# Patient Record
Sex: Female | Born: 1980 | Race: White | Hispanic: No | Smoking: Current every day smoker
Health system: Southern US, Community
[De-identification: ages and names within clinical notes are randomized; demographics above are authoritative.]

## PROBLEM LIST (undated history)

## (undated) DIAGNOSIS — F419 Anxiety disorder, unspecified: Secondary | ICD-10-CM

## (undated) DIAGNOSIS — N83209 Unspecified ovarian cyst, unspecified side: Secondary | ICD-10-CM

## (undated) DIAGNOSIS — R51 Headache: Secondary | ICD-10-CM

## (undated) DIAGNOSIS — N809 Endometriosis, unspecified: Secondary | ICD-10-CM

## (undated) DIAGNOSIS — I82402 Acute embolism and thrombosis of unspecified deep veins of left lower extremity: Secondary | ICD-10-CM

## (undated) DIAGNOSIS — M549 Dorsalgia, unspecified: Secondary | ICD-10-CM

## (undated) HISTORY — DX: Endometriosis, unspecified: N80.9

## (undated) HISTORY — PX: URETHRA SURGERY: SHX824

## (undated) HISTORY — DX: Anxiety disorder, unspecified: F41.9

## (undated) HISTORY — PX: PELVIC LAPAROSCOPY: SHX162

## (undated) HISTORY — PX: TONSILLECTOMY AND ADENOIDECTOMY: SUR1326

## (undated) HISTORY — DX: Unspecified ovarian cyst, unspecified side: N83.209

---

## 2009-02-23 ENCOUNTER — Emergency Department: Payer: Self-pay | Admitting: Emergency Medicine

## 2009-02-23 IMAGING — CR DG CHEST 2V
1 series · 3 of 3 positions shown · non-contrast
Comparison: none

REASON FOR EXAM: back pain left scapula pain
COMMENTS:

PROCEDURE:     DXR - DXR CHEST PA (OR AP) AND LATERAL  - February 23, 2009  [DATE]
RESULT:     The lungs are clear. The cardiac silhouette and visualized bony
skeleton are unremarkable.  There does appear to be a mild pectus excavatum
deformity.

[Series 1: view not recorded · 0.17mm/px · 3 of 3 slices shown]
[im 1/3]
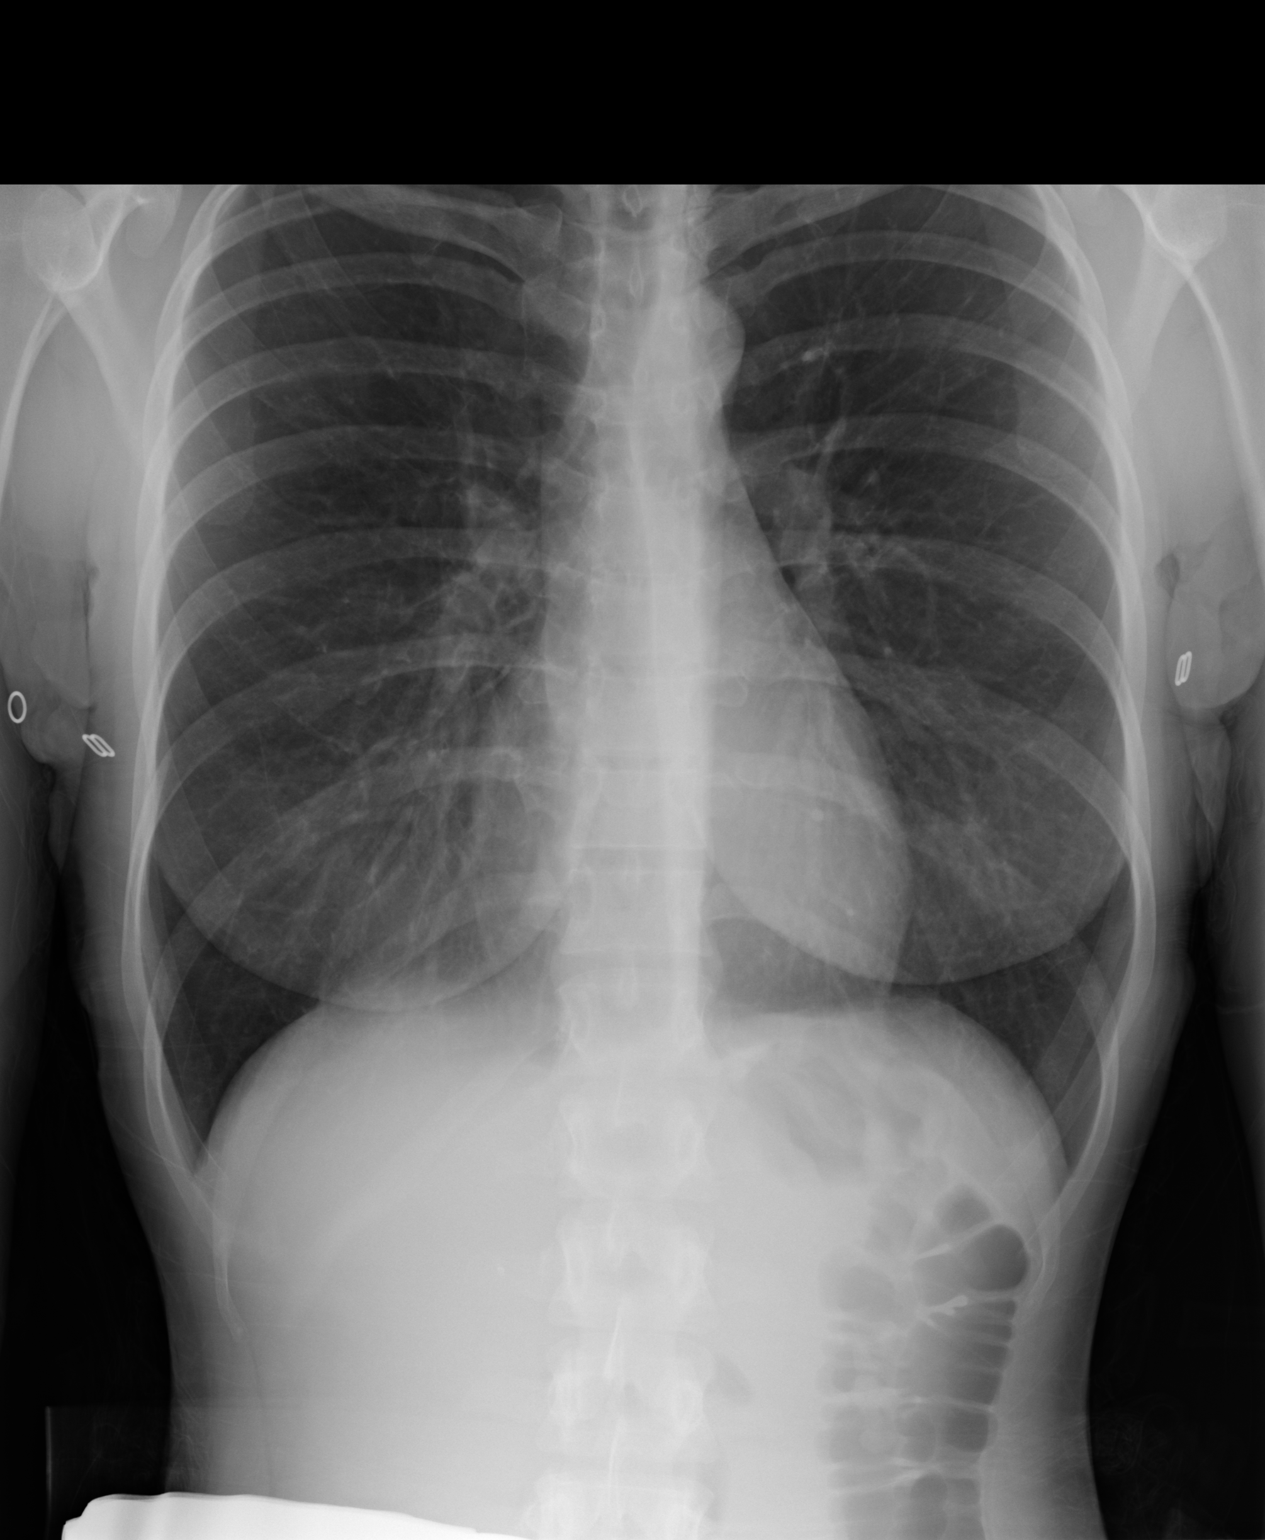
[im 2/3]
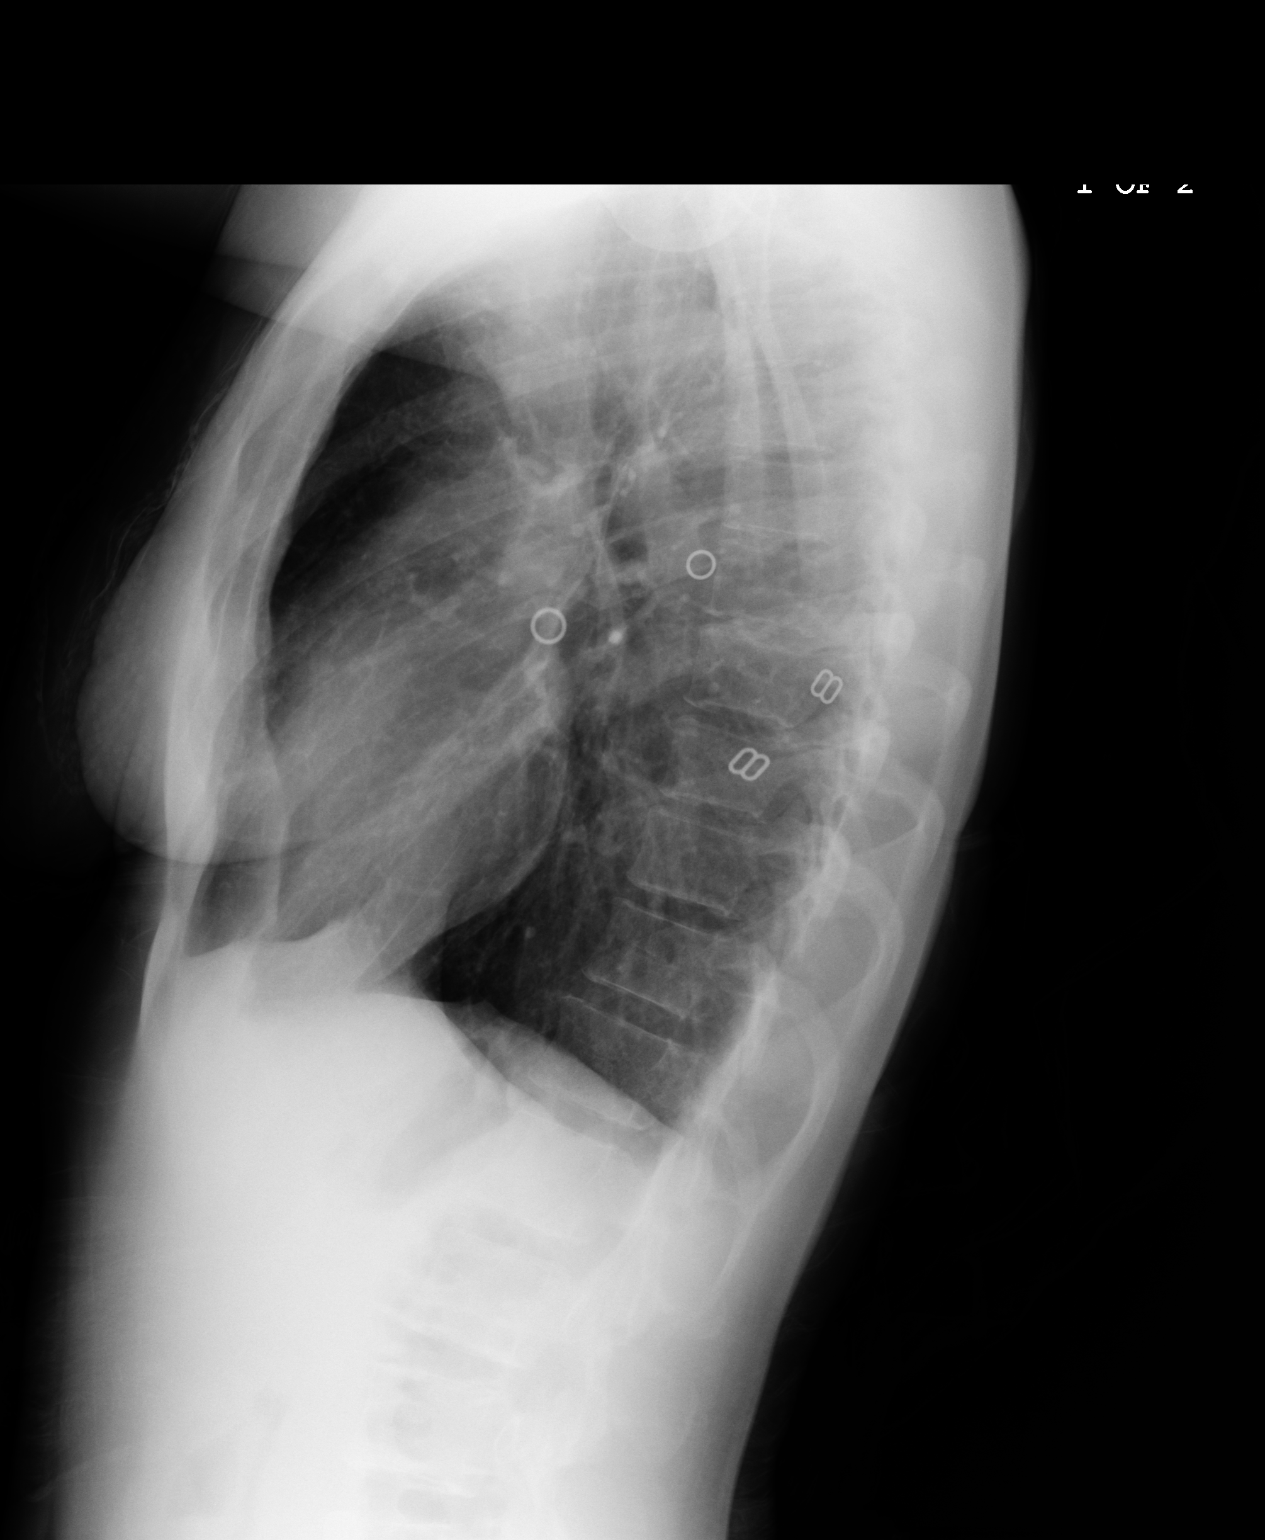
[im 3/3]
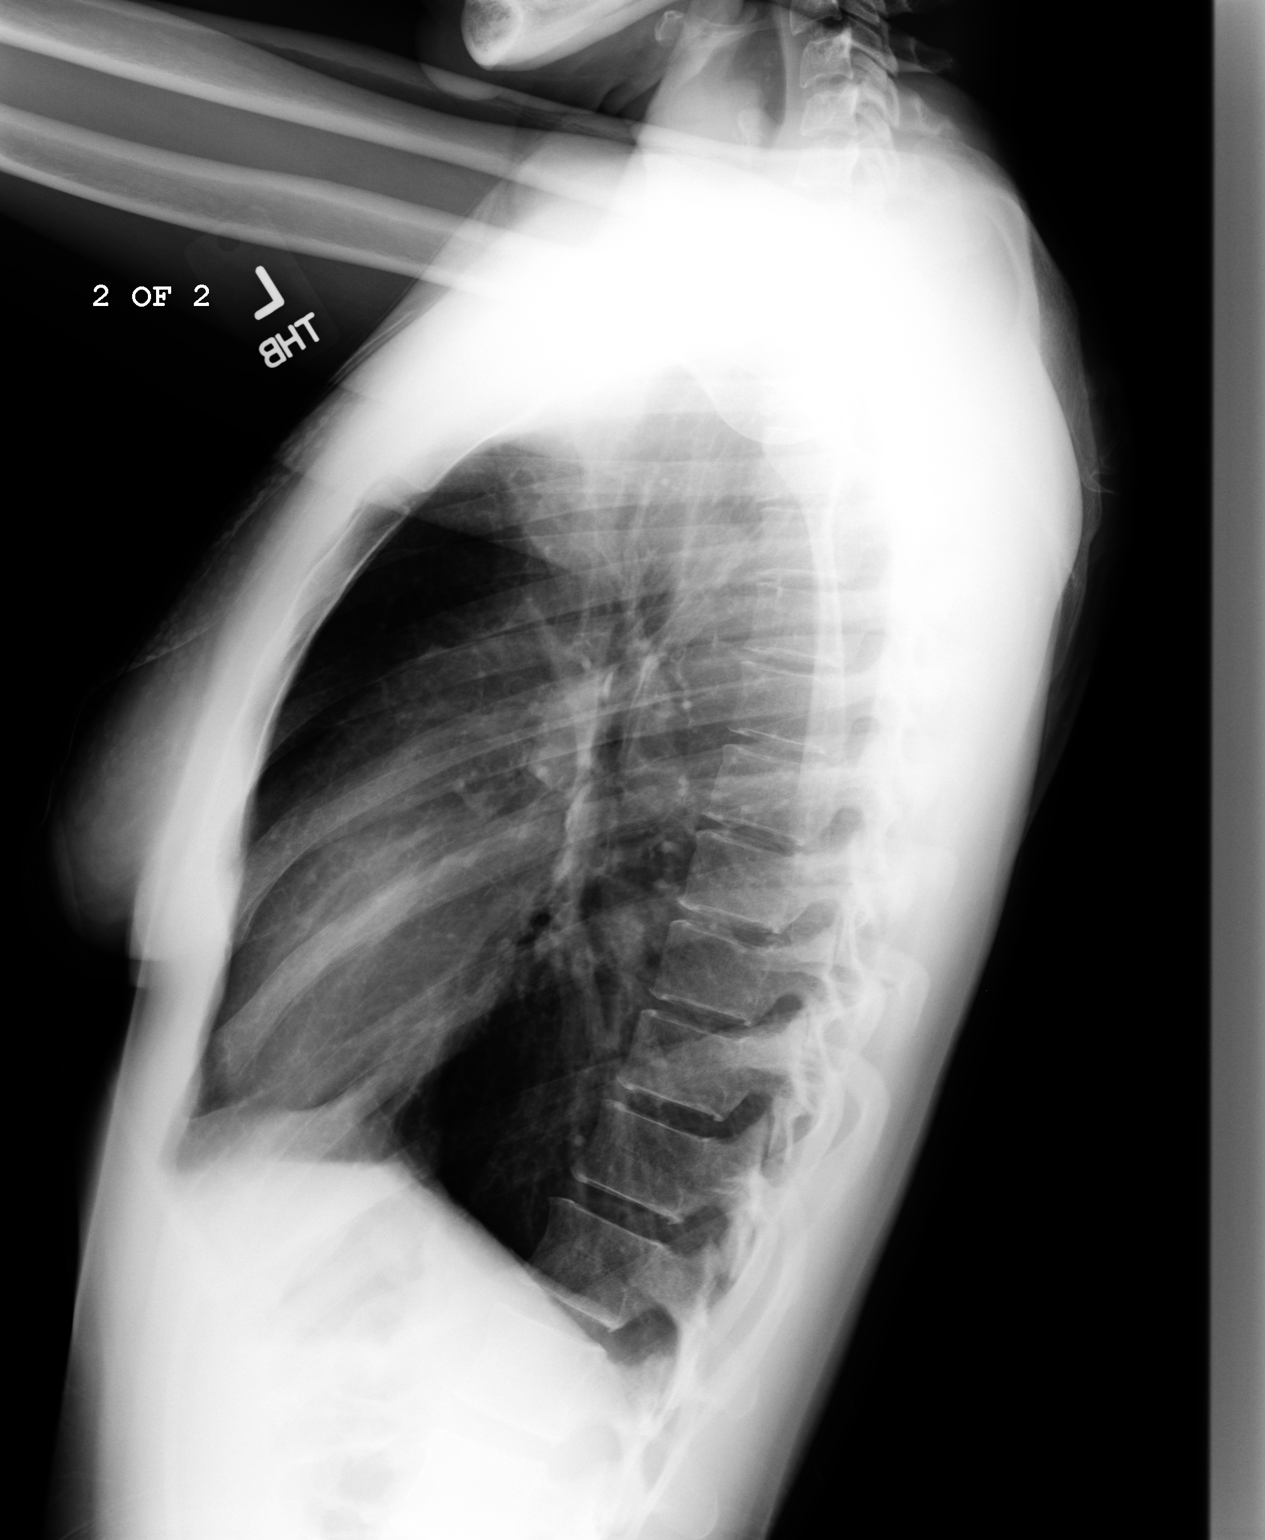

[3 of 3 positions shown; findings below may reference images not displayed]

IMPRESSION: Chest radiograph without evidence of acute cardiopulmonary
disease.

## 2009-02-23 IMAGING — CR DG SHOULDER 3+V*L*
1 series · 3 of 3 positions shown · non-contrast
Comparison: none

REASON FOR EXAM: pain
COMMENTS:

PROCEDURE:     DXR - DXR SHOULDER LEFT COMPLETE  - February 23, 2009  [DATE]
RESULT:     There does not appear to be evidence of fracture, dislocation or
malalignment.  The left lung apex appears unremarkable.

[Series 1: view not recorded · 0.17mm/px · 3 of 3 slices shown]
[im 1/3]
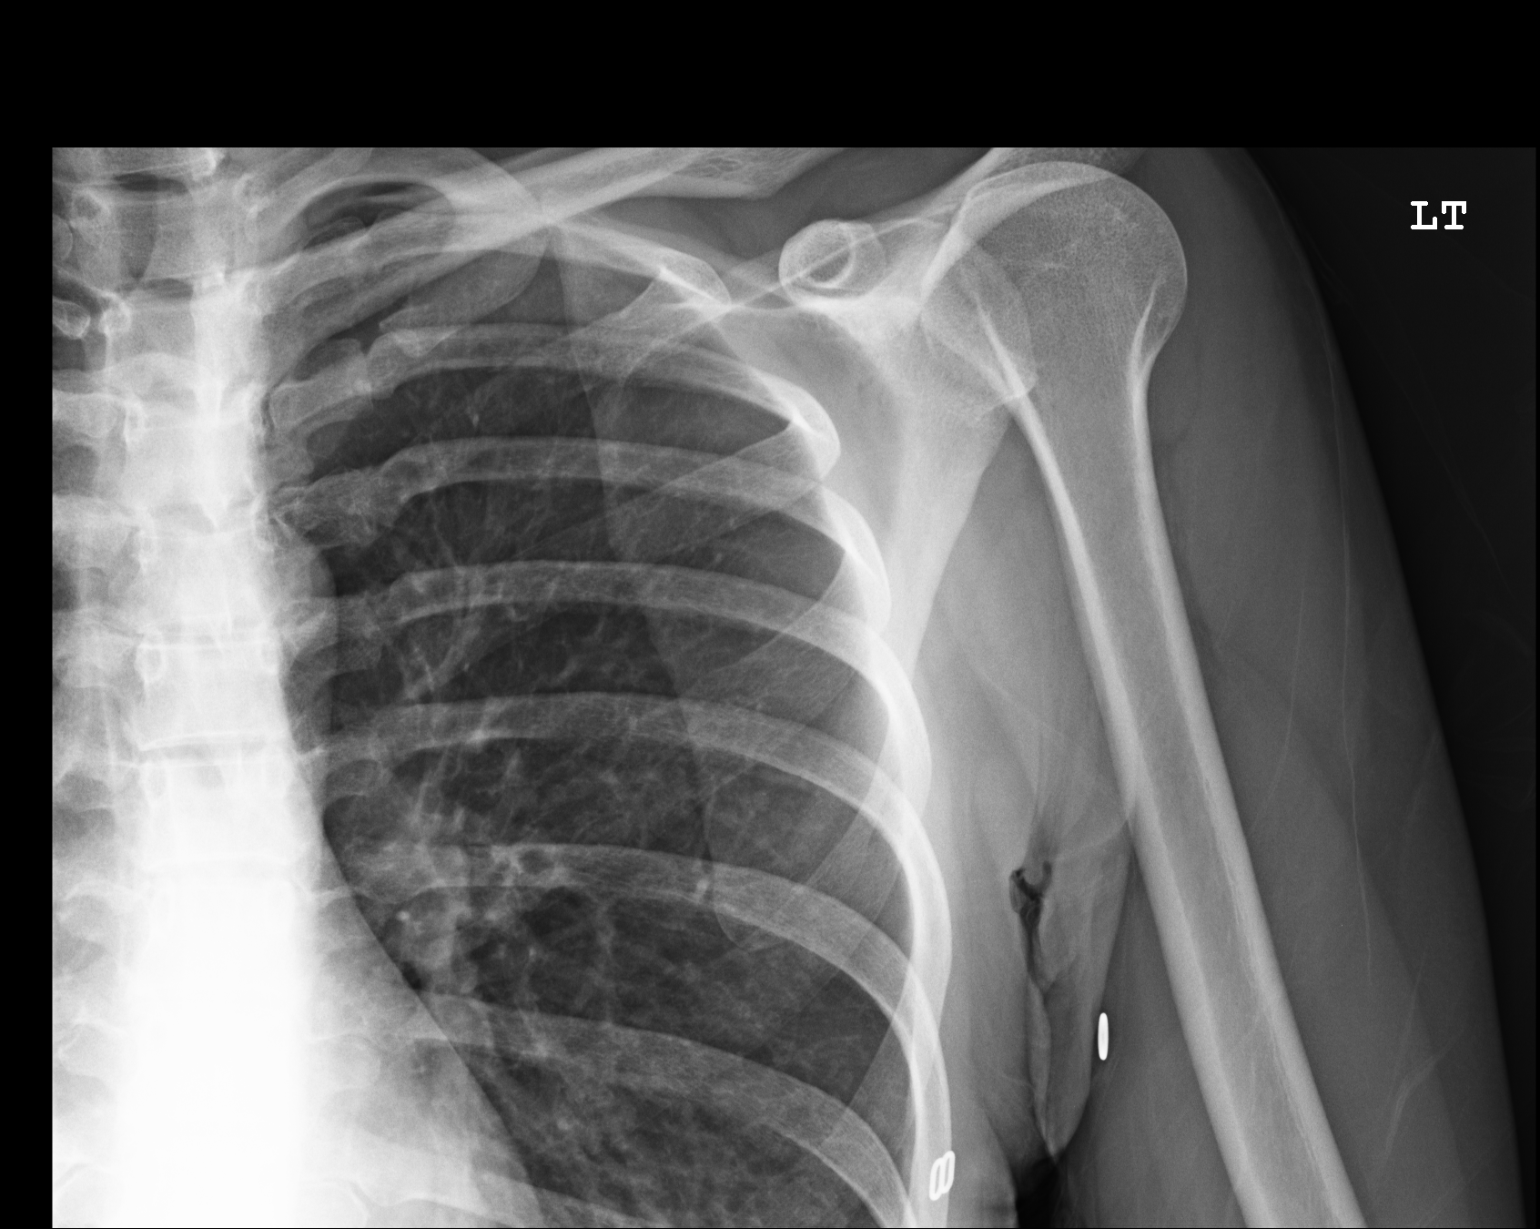
[im 2/3]
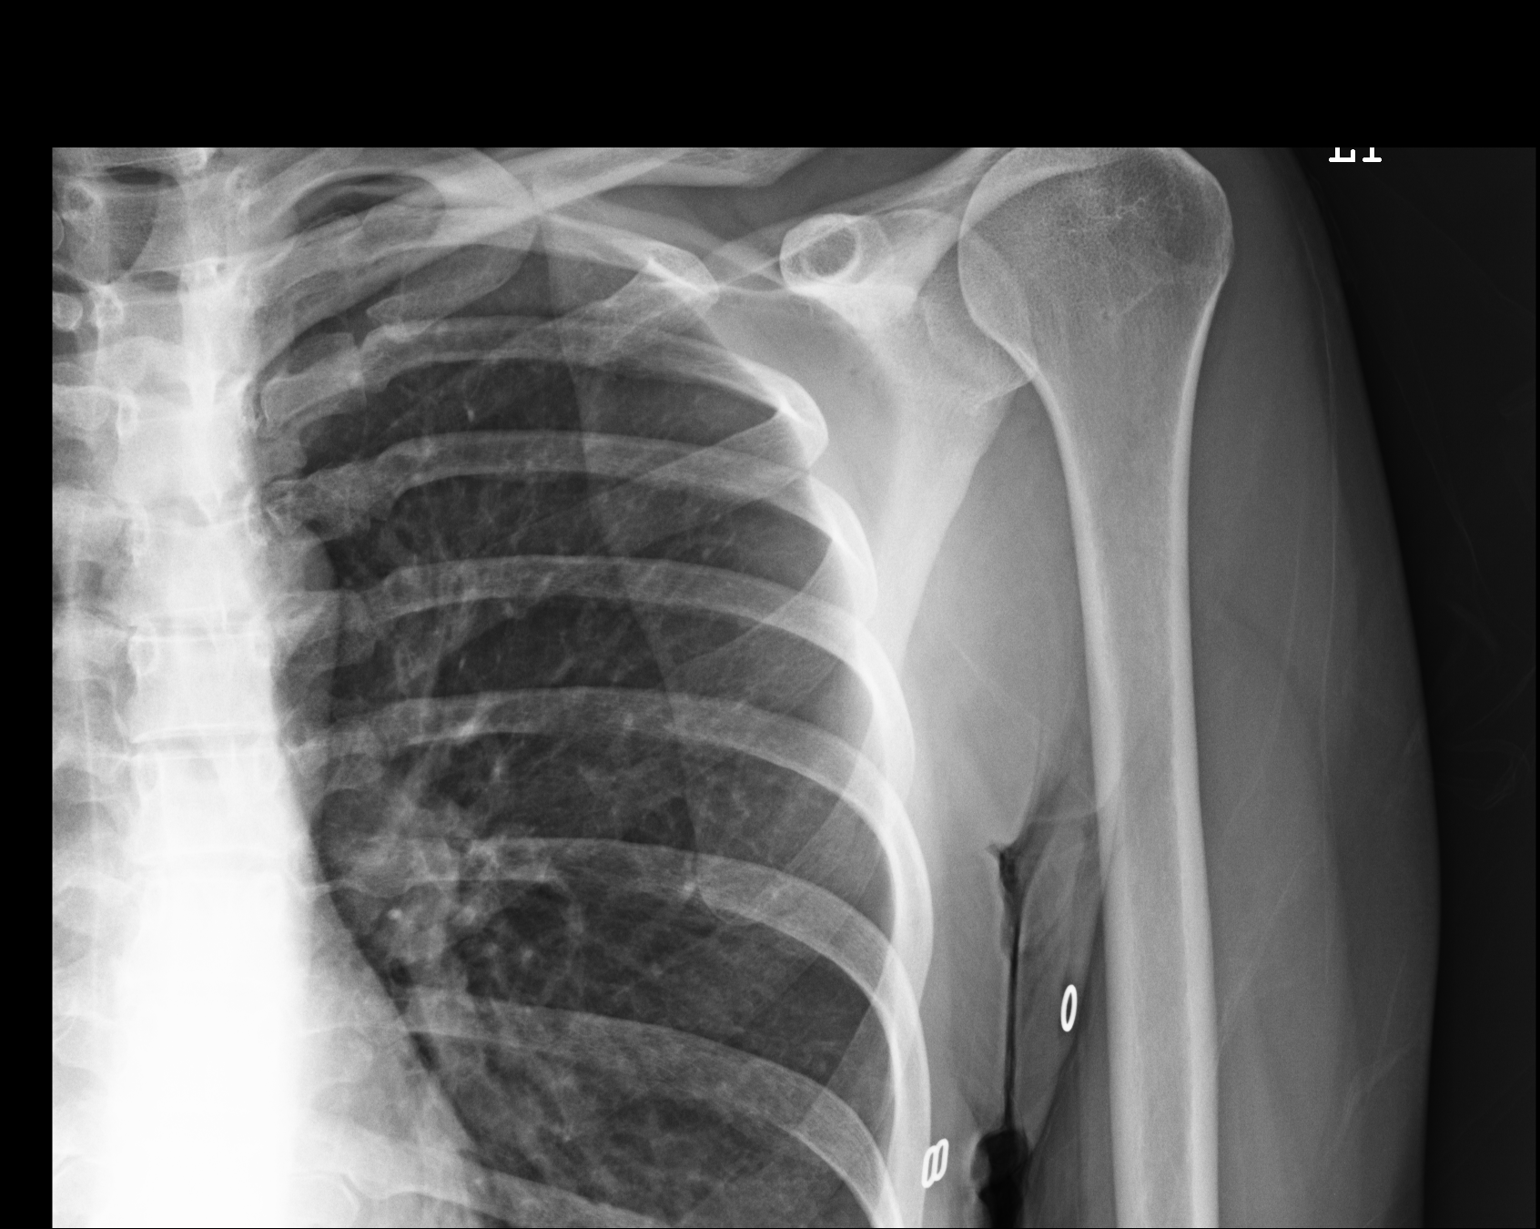
[im 3/3]
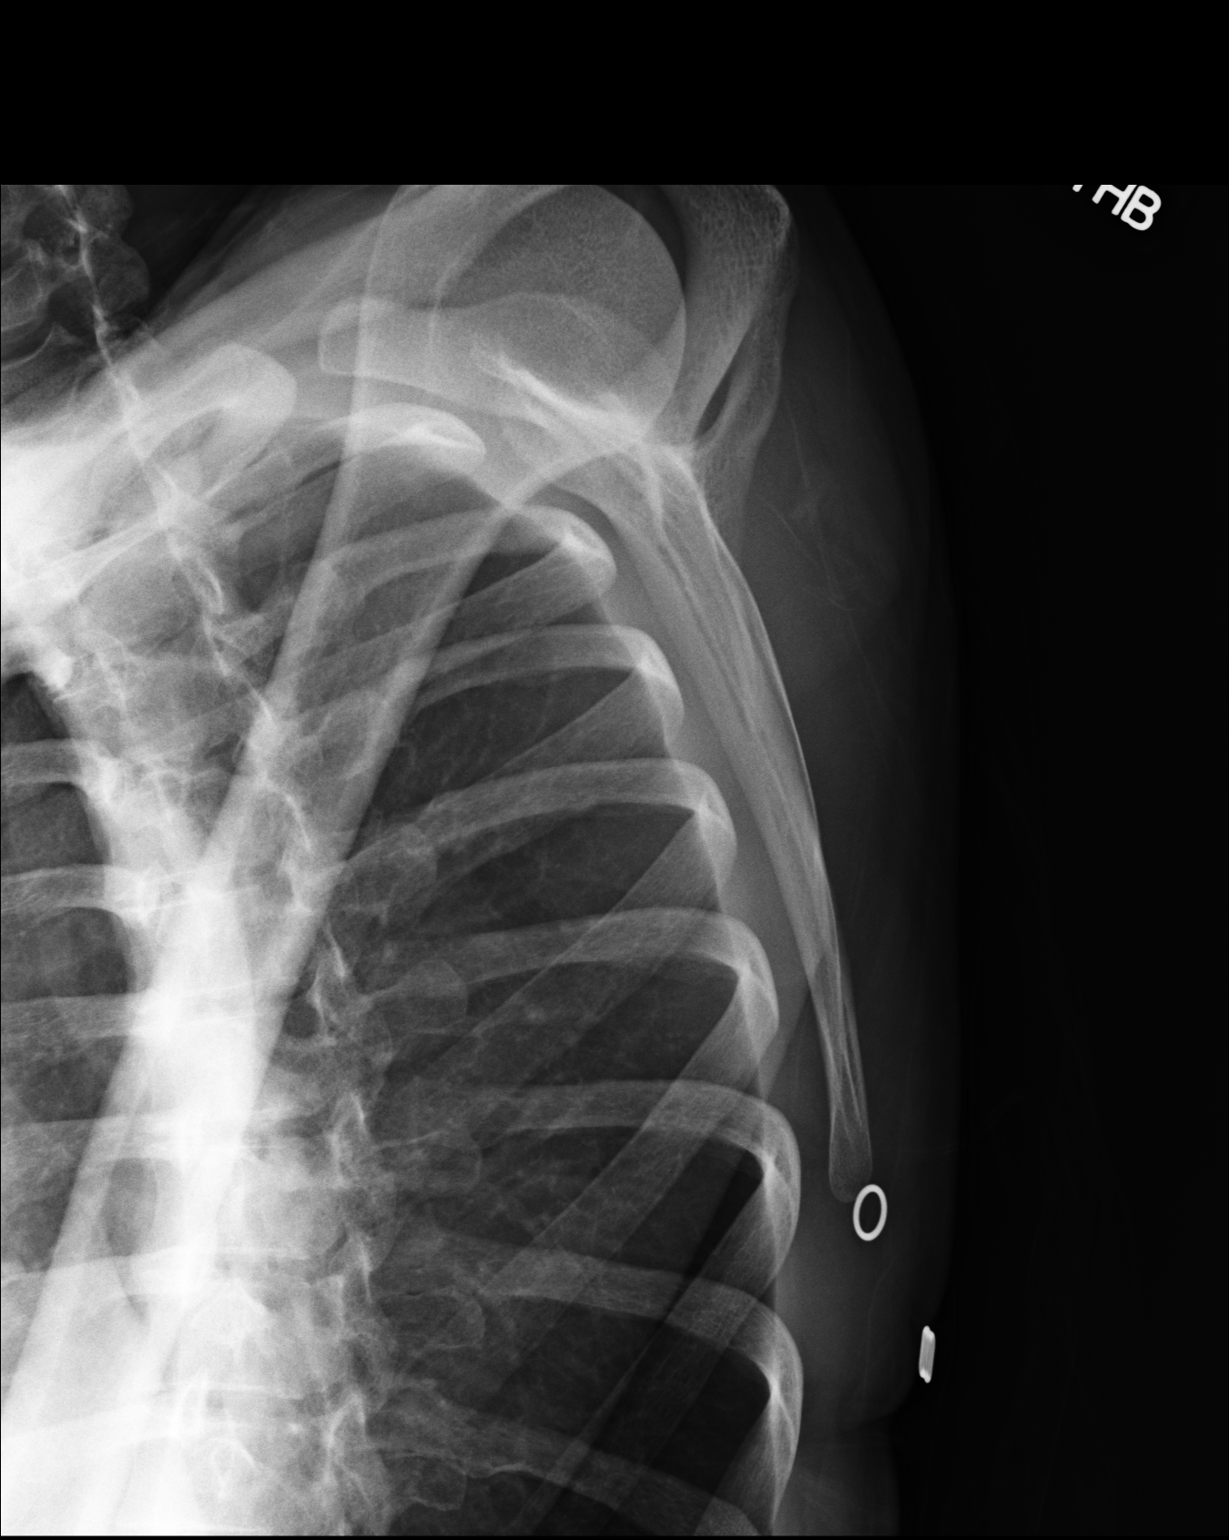

[3 of 3 positions shown; findings below may reference images not displayed]

IMPRESSION: 1.     No acute osseous abnormality.
2.     If there is persistent clinical concern or persistent complaints of
pain, repeat evaluation in 7-10 days is recommended or alternatively MRI, if
clinically warranted.
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
## 2010-04-10 ENCOUNTER — Inpatient Hospital Stay (HOSPITAL_COMMUNITY): Admission: AD | Admit: 2010-04-10 | Discharge: 2010-04-10 | Payer: Self-pay | Admitting: Obstetrics and Gynecology

## 2010-04-15 ENCOUNTER — Inpatient Hospital Stay (HOSPITAL_COMMUNITY): Admission: RE | Admit: 2010-04-15 | Discharge: 2010-04-17 | Payer: Self-pay | Admitting: Obstetrics and Gynecology

## 2010-08-12 ENCOUNTER — Ambulatory Visit: Payer: Self-pay | Admitting: Family Medicine

## 2010-08-12 IMAGING — CR DG LUMBAR SPINE 2-3V
1 series · 2 of 2 positions shown · non-contrast
Comparison: none

REASON FOR EXAM: lower back pain
COMMENTS:

[Series 2: view not recorded · 0.17mm/px · 2 of 2 slices shown]
[im 1/2]
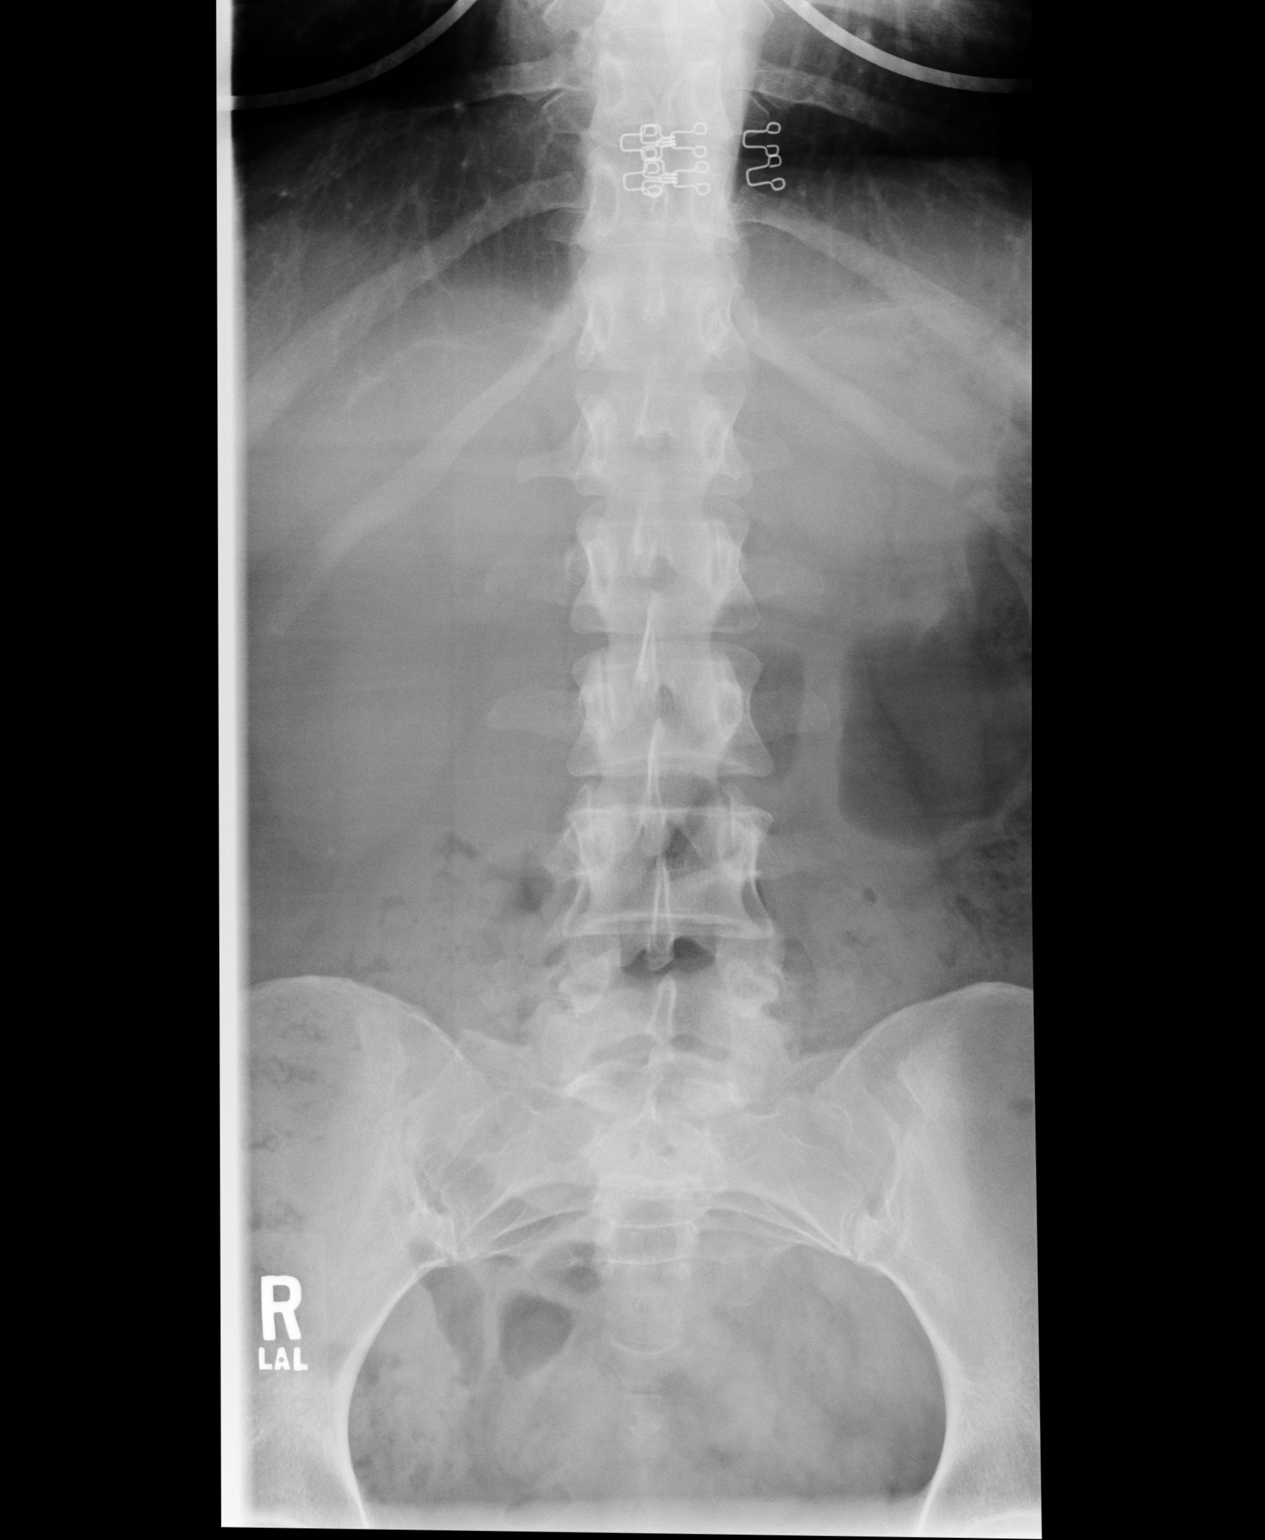
[im 2/2]
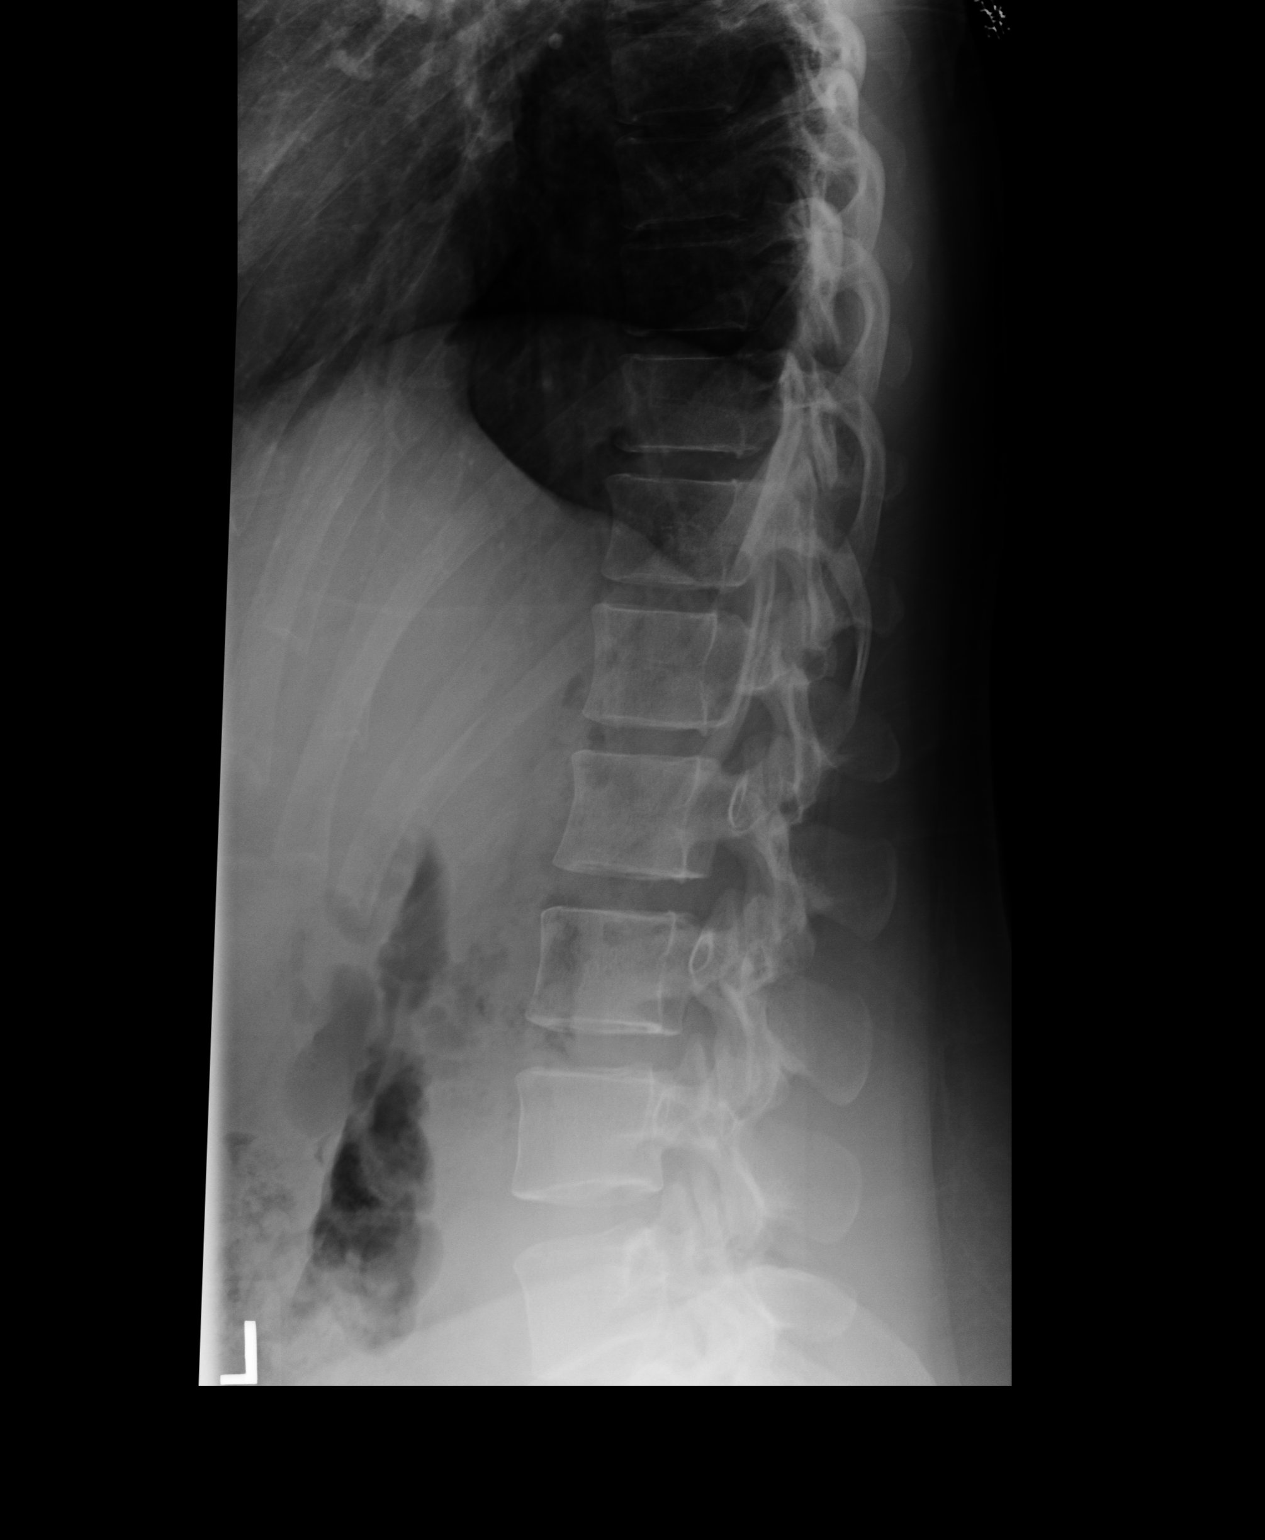

[2 of 2 positions shown; findings below may reference images not displayed]

PROCEDURE:     KDR - KDXR LUMBAR SPINE AP AND LATERAL  - August 12, 2010 [DATE]

RESULT:     Images of the lumbar spine in the AP and lateral projections
demonstrate the vertebral body heights and intervertebral disc spaces are
maintained. A L5-S1 spot view was not performed. There is no bony
destruction or fracture.
IMPRESSION: Please see above.

## 2011-02-13 LAB — CBC
HCT: 30.5 % — ABNORMAL LOW (ref 36.0–46.0)
Hemoglobin: 10.8 g/dL — ABNORMAL LOW (ref 12.0–15.0)
Hemoglobin: 12.8 g/dL (ref 12.0–15.0)
MCHC: 35.3 g/dL (ref 30.0–36.0)
MCV: 96 fL (ref 78.0–100.0)
Platelets: 205 10*3/uL (ref 150–400)
RBC: 3.74 MIL/uL — ABNORMAL LOW (ref 3.87–5.11)
WBC: 11 10*3/uL — ABNORMAL HIGH (ref 4.0–10.5)
WBC: 18.6 10*3/uL — ABNORMAL HIGH (ref 4.0–10.5)

## 2011-02-13 LAB — RPR: RPR Ser Ql: NONREACTIVE

## 2012-03-01 ENCOUNTER — Emergency Department: Payer: Self-pay | Admitting: *Deleted

## 2012-03-01 LAB — URINALYSIS, COMPLETE
Bacteria: NONE SEEN
Bilirubin,UR: NEGATIVE
Ketone: NEGATIVE
Nitrite: NEGATIVE
Ph: 7 (ref 4.5–8.0)
RBC,UR: 1 /HPF (ref 0–5)
Transitional Epi: <1

## 2012-03-01 LAB — PREGNANCY, URINE: Pregnancy Test, Urine: NEGATIVE m[IU]/mL

## 2012-03-07 ENCOUNTER — Ambulatory Visit: Payer: Self-pay | Admitting: Obstetrics and Gynecology

## 2012-03-07 IMAGING — US TRANSABDOMINAL ULTRASOUND OF PELVIS
1 series · 17 of 25 positions shown · non-contrast
Comparison: none

REASON FOR EXAM: pelvic pain LLQ Mid lower pain
COMMENTS:

[Series 1: transabdominal ultrasound of pelvis · 17 of 72 slices shown]
[im 1/72]
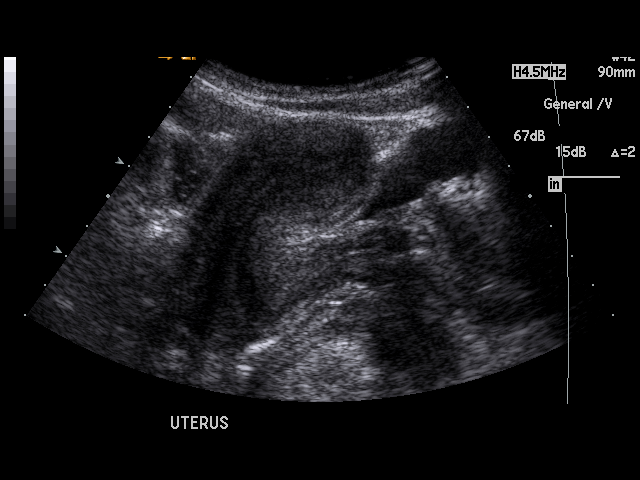
[im 6/72]
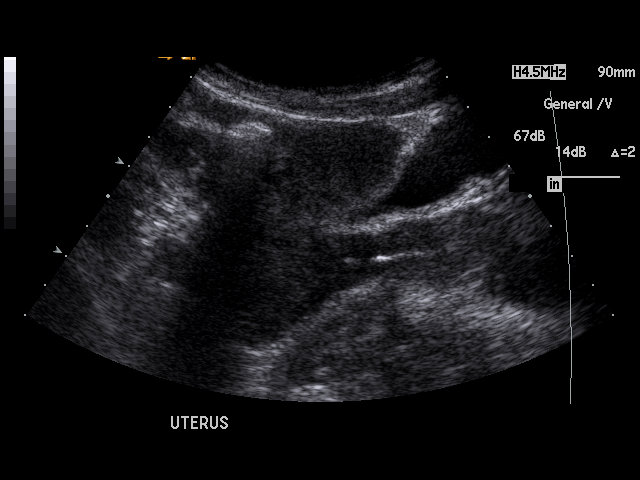
[im 9/72]
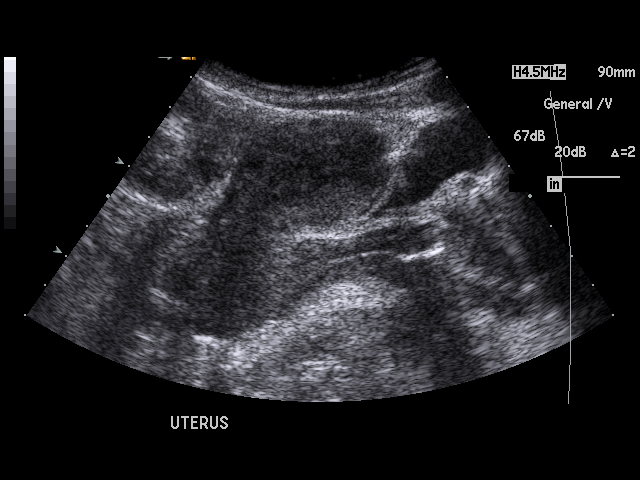
[im 15/72]
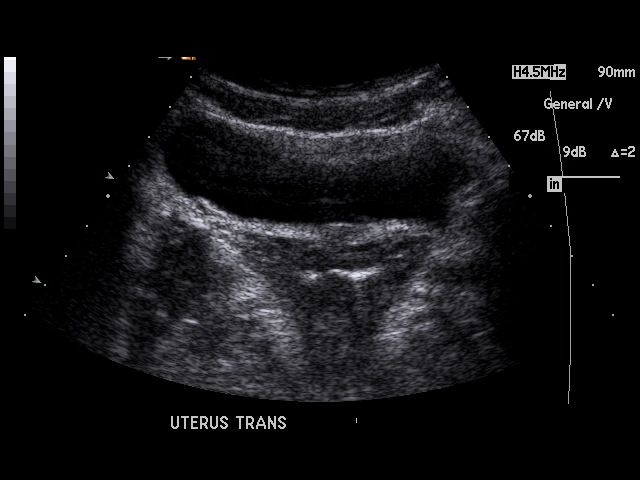
[im 18/72]
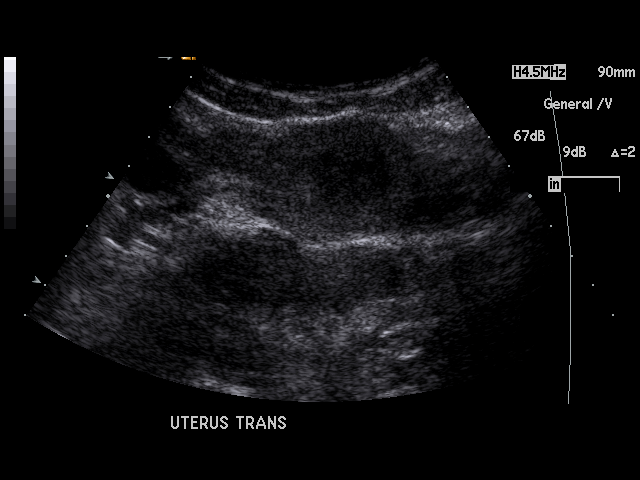
[im 24/72]
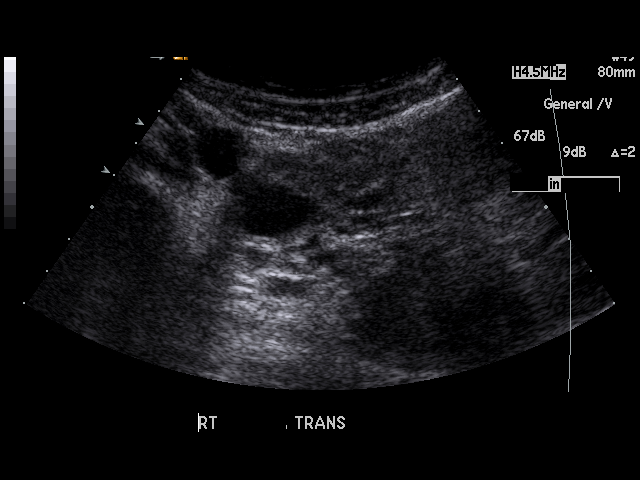
[im 27/72]
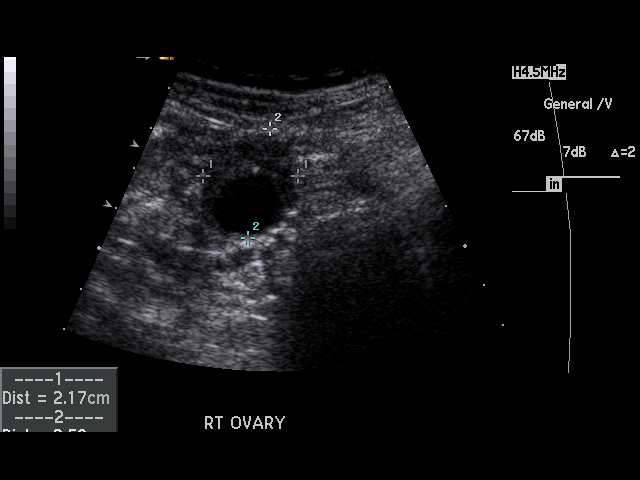
[im 33/72]
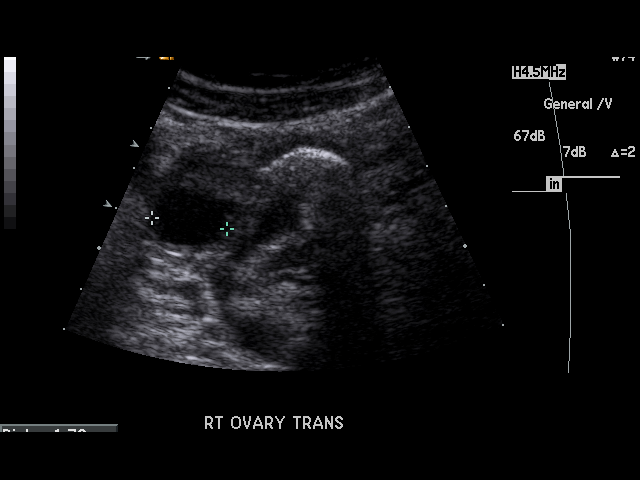
[im 36/72]
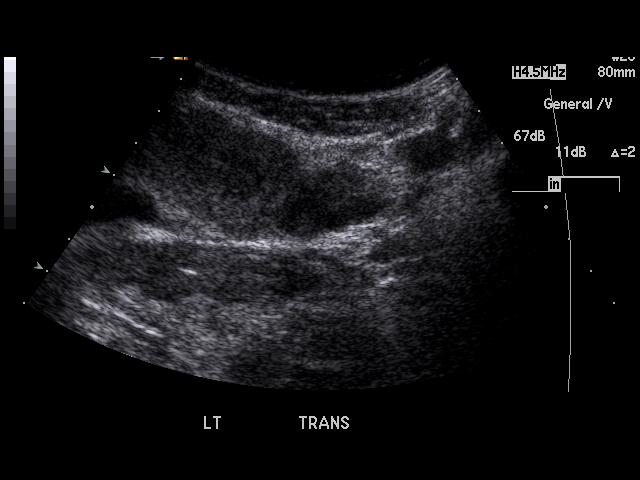
[im 39/72]
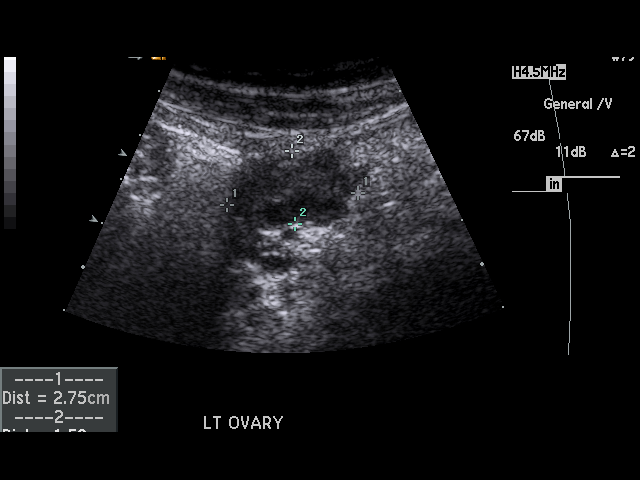
[im 45/72]
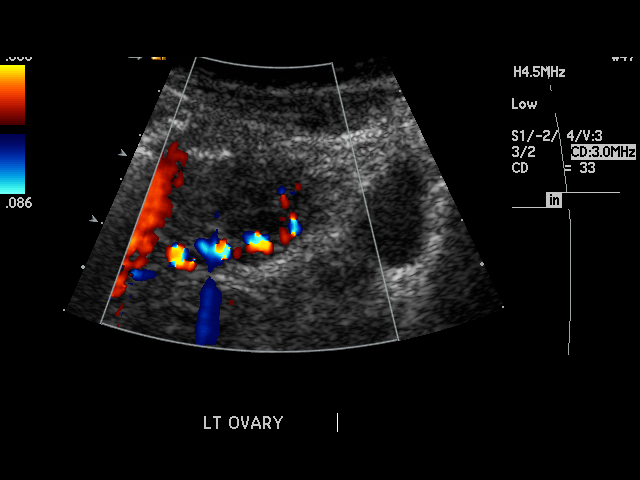
[im 48/72]
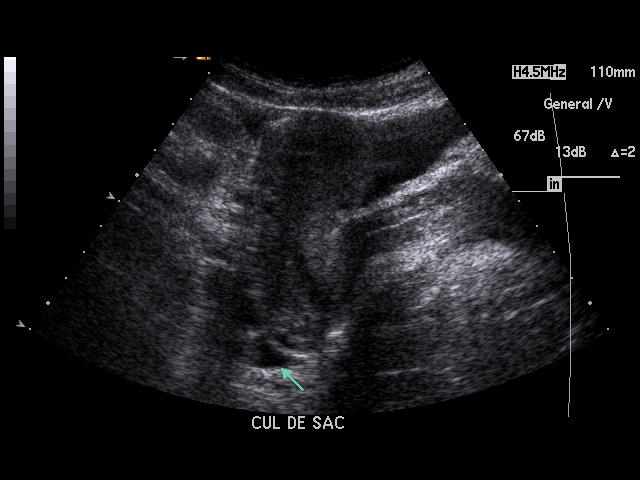
[im 54/72]
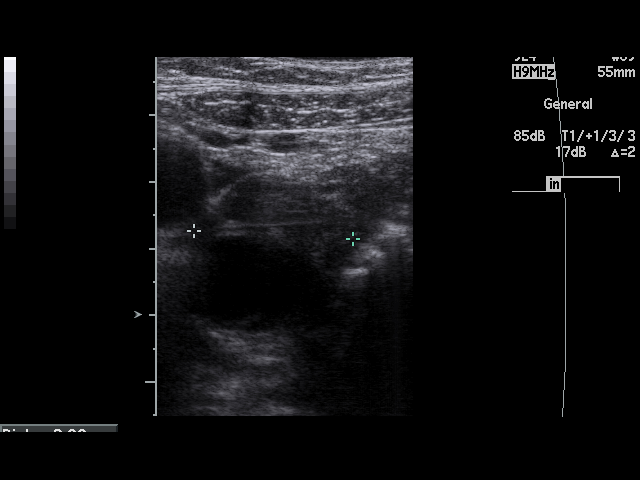
[im 57/72]
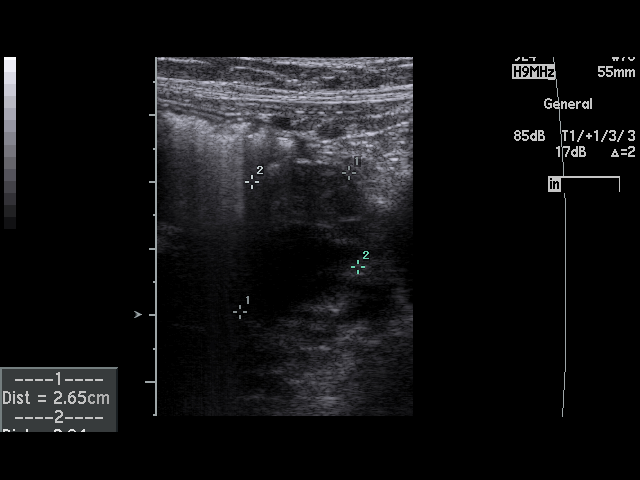
[im 63/72]
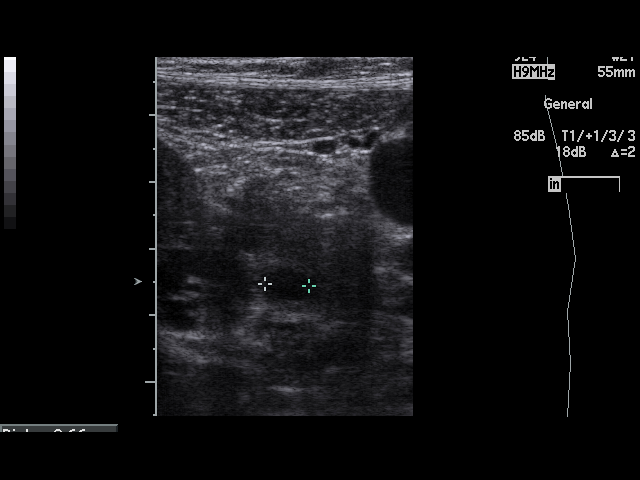
[im 66/72]
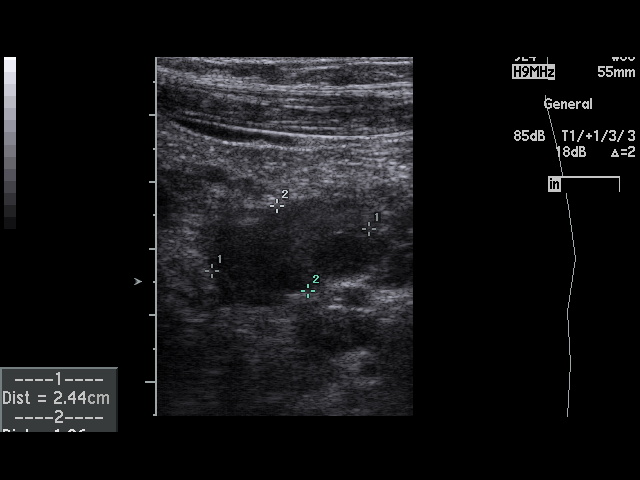
[im 72/72]
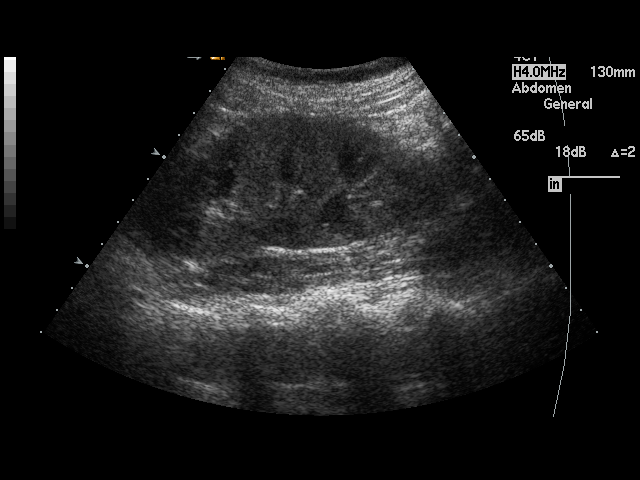

[17 of 25 positions shown; findings below may reference images not displayed]

PROCEDURE:     MANDIA - MANDIA PELVIS NON-OB  - March 07, 2012  [DATE]

RESULT:     Transabdominal pelvic ultrasound was performed. The uterus
measures 7.06 cm x 2.82 cm x 4.94 cm. No uterine mass is seen. The
endometrium measures 3.1 mm in thickness. The right and left ovaries are
visualized. The right ovary measures 2.65 cm at maximum diameter and the
left ovary measures 2.44 cm at maximum diameter. Vascular flow is observed
in each ovary on Doppler examination. A few ovarian follicles are noted
bilaterally. No abnormal adnexal masses are seen. There is a nonspecific
trace of free fluid in the cul-de-sac. The kidneys are visualized
bilaterally and show no hydronephrosis.
IMPRESSION: 1. No intrauterine products of conception are seen.
2. No abnormal adnexal masses are identified.
3. There is a nonspecific trace of free fluid in the pelvis.

## 2012-03-13 ENCOUNTER — Ambulatory Visit: Payer: Self-pay | Admitting: Obstetrics and Gynecology

## 2012-03-13 LAB — CBC
HGB: 14.5 g/dL (ref 12.0–16.0)
MCHC: 33.2 g/dL (ref 32.0–36.0)
MCV: 92 fL (ref 80–100)
Platelet: 233 10*3/uL (ref 150–440)
RBC: 4.74 10*6/uL (ref 3.80–5.20)
WBC: 10.9 10*3/uL (ref 3.6–11.0)

## 2012-03-18 ENCOUNTER — Ambulatory Visit: Payer: Self-pay | Admitting: Obstetrics and Gynecology

## 2012-06-12 ENCOUNTER — Ambulatory Visit: Payer: Self-pay | Admitting: Obstetrics and Gynecology

## 2012-06-12 LAB — CBC
HCT: 41.4 % (ref 35.0–47.0)
MCH: 30.3 pg (ref 26.0–34.0)
MCV: 94 fL (ref 80–100)
Platelet: 222 10*3/uL (ref 150–440)
RBC: 4.42 10*6/uL (ref 3.80–5.20)
WBC: 7.4 10*3/uL (ref 3.6–11.0)

## 2012-06-17 ENCOUNTER — Ambulatory Visit: Payer: Self-pay | Admitting: Obstetrics and Gynecology

## 2012-06-17 HISTORY — PX: VAGINAL HYSTERECTOMY: SUR661

## 2012-06-19 LAB — PATHOLOGY REPORT

## 2013-05-01 ENCOUNTER — Encounter: Payer: Self-pay | Admitting: Family Medicine

## 2013-05-01 ENCOUNTER — Ambulatory Visit (INDEPENDENT_AMBULATORY_CARE_PROVIDER_SITE_OTHER): Payer: Medicaid Other | Admitting: Family Medicine

## 2013-05-01 VITALS — BP 132/80 | HR 82 | Resp 16 | Ht 63.0 in | Wt 128.0 lb

## 2013-05-01 DIAGNOSIS — N949 Unspecified condition associated with female genital organs and menstrual cycle: Secondary | ICD-10-CM

## 2013-05-01 DIAGNOSIS — R102 Pelvic and perineal pain: Secondary | ICD-10-CM | POA: Insufficient documentation

## 2013-05-01 DIAGNOSIS — N809 Endometriosis, unspecified: Secondary | ICD-10-CM

## 2013-05-01 MED ORDER — OXYCODONE-ACETAMINOPHEN 7.5-325 MG PO TABS: 1.0000 | ORAL_TABLET | Freq: Four times a day (QID) | ORAL | Status: DC | PRN

## 2013-05-01 NOTE — Patient Instructions (Addendum)
Endometriosis Endometriosis is a disease that occurs when the endometrium (lining of the uterus) is misplaced outside of its normal location. It may occur in many locations close to the uterus (womb), but commonly on the ovaries, fallopian tubes, vagina (birth canal) and bowel located close to the uterus. Because the uterus sloughs (expels) its lining every month (menses), there is bleeding whereever the endometrial tissue is located. SYMPTOMS  Often there are no symptoms. However, because blood is irritating to tissues not normally exposed to it, when symptoms occur they vary with the location of the misplaced endometrium. Symptoms often include back and abdominal pain. Periods may be heavier and intercourse may be painful. Infertility may be present. You may have all of these symptoms at one time or another or you may have months with no symptoms at all. Although the symptoms occur mainly during menses, they can occur mid-cycle as well, and usually terminate with menopause. DIAGNOSIS  Your caregiver may recommend a blood test and urine test (urinalysis) to help rule out other conditions. Another common test is ultrasound, a painless procedure that uses sound waves to make a sonogram "picture" of abnormal tissue that could be endometriosis. If your bowel movements are painful around your periods, your caregiver may advise a barium enema (an X-ray of the lower bowel), to try to find the source of your pain. This is sometimes confirmed by laparoscopy. Laparoscopy is a procedure where your caregiver looks into your abdomen with a laparoscope (a small pencil sized telescope). Your caregiver may take a tiny piece of tissue (biopsy) from any abnormal tissue to confirm or document your problem. These tissues are sent to the lab and a pathologist looks at them under the microscope to give a microscopic diagnosis. TREATMENT  Once the diagnosis is made, it can be treated by destruction of the misplaced endometrial  tissue using heat (diathermy), laser, cutting (excision), or chemical means. It may also be treated with hormonal therapy. When using hormonal therapy menses are eliminated, therefore eliminating the monthly exposure to blood by the misplaced endometrial tissue. Only in severe cases is it necessary to perform a hysterectomy with removal of the tubes, uterus and ovaries. HOME CARE INSTRUCTIONS   Only take over-the-counter or prescription medicines for pain, discomfort, or fever as directed by your caregiver.  Avoid activities that produce pain, including a physical sexual relationship.  Do not take aspirin as this may increase bleeding when not on hormonal therapy.  See your caregiver for pain or problems not controlled with treatment. SEEK IMMEDIATE MEDICAL CARE IF:   Your pain is severe and is not responding to pain medicine.  You develop severe nausea and vomiting, or you cannot keep foods down.  Your pain localizes to the right lower part of your abdomen (possible appendicitis).  You have swelling or increasing pain in the abdomen.  You have a fever.  You see blood in your stool. MAKE SURE YOU:   Understand these instructions.  Will watch your condition.  Will get help right away if you are not doing well or get worse. Document Released: 11/10/2000 Document Revised: 02/05/2012 Document Reviewed: 07/01/2008 [facility] Patient Information 2014 [place], [place]. Bilateral Salpingo-Oophorectomy Removal of both fallopian tubes and ovaries is called a Bilateral Salpingo-oophorectomy (BSO). The fallopian tubes transport the egg from the ovary to the womb (uterus). The fallopian tube is also where the sperm and egg meet and become fertilized and move down into the uterus. Usually when a BSO is done, the uterus was previously  removed. Removing both tubes and ovaries will:  Put you into the menopause. You will no longer have menstrual periods.  May cause you to have symptoms of menopause  (hot flashes, night sweats, mood changes).  Not affect your sex drive or physical relationship.  Cause you to not be able to become pregnant (sterile). LET YOUR CAREGIVER KNOW ABOUT:  Allergies to food or medication.  Medications taken including herbs, eye drops, over-the-counter medications, and creams.  Use of steroids (by mouth or creams).  Previous problems with anesthetics or numbing medication.  Possibility of pregnancy, if this applies.  Your smoking habits  History of blood clots (thrombophlebitis).  History of bleeding or blood problems.  Previous surgeries.  Other health problems. RISKS AND COMPLICATIONS All surgery is associated with risks. Some of these risks are:  Injury to surrounding organs.  Bleeding.  Infection.  Blood clots in the legs or lungs.  Problems with the anesthesia.  The surgery does not help the problem.  Death. BEFORE THE PROCEDURE  Do not take aspirin or blood thinners because it can make you bleed.  Do not eat or drink anything at least 8 hours before the surgery.  Let your caregiver know if you develop a cold or an infection.  If you are being admitted the day of surgery, arrive at least 1 hour before the surgery to read and sign the necessary forms and consents.  Arrange for help when you go home from the hospital.  If you smoke, do not smoke for at least 2 weeks before the surgery. PROCEDURE  You will change into a hospital gown. Then, you will be given an IV (intravenous) and a medication to relax you. You will be put to sleep with an anesthetic. Any hair on your lower belly (abdomen) will be removed, and a catheter will be placed in your bladder. The fallopian tubes and ovaries will be removed either through 2 very small cuts (incisions) or through large incision in the lower abdomen. AFTER THE PROCEDURE  You will be taken to the recovery room for 1 to 3 hours until your blood pressure, pulse, and temperature are stable  and you are waking up.  If you had a laparoscopy, you may be discharged in several hours.  If you had a laparoscopy, you may have shoulder pain for a day or two from air left in the abdomen. The air can irritate the nerve that goes from the diaphragm to the shoulder.  You will be given pain medication as is necessary.  The intravenous and catheter will be removed.  Have someone available to take you home from the hospital. HOME CARE INSTRUCTIONS   Only take over-the-counter or prescription medicines for pain, discomfort, or fever as directed by your caregiver.  Do not take aspirin. It can cause bleeding.  Do not drive when taking pain medication.  Follow your caregiver's advice regarding diet, exercise, lifting, driving, and general activities.  You may resume your usual diet as directed and allowed.  Get plenty of rest and sleep.  Do not douche, use tampons, or have sexual intercourse until your caregiver says it is okay.  Change your bandages (dressings) as directed.  Take your temperature twice a day and write it down.  Your caregiver may recommend showers instead of baths for a few weeks.  Do not drink alcohol until your caregiver says it is okay.  If you develop constipation, you may take a mild laxative with your caregiver's permission. Bran foods and drinking  fluids helps with constipation problems.  Try to have someone home with you for a week or two to help with the household activities.  Make sure you and your family understands everything about your operation and recovery.  Do not sign any legal documents until you feel normal again.  Keep all your follow-up appointments. SEEK MEDICAL CARE IF:   There is swelling, redness, or increasing pain in the wound area.  Pus is coming from the wound.  You notice a bad smell from the wound or surgical dressing.  You have pain, redness, or swelling from the intravenous site.  The wound is breaking open (the edges  are not staying together).  You feel dizzy or feel like fainting.  You develop pain or bleeding when you urinate.  You develop diarrhea.  You develop nausea and vomiting.  You develop abnormal vaginal discharge.  You develop a rash.  You have any type of abnormal reaction or develop an allergy to your medication.  You need stronger pain medication for your pain. SEEK IMMEDIATE MEDICAL CARE IF:   You develop an unexplained temperature above 100 F (37.8 C).  You develop abdominal pain.  You develop chest pain.  You develop shortness of breath.  You pass out.  You develop pain, swelling, or redness of your leg.  You develop heavy vaginal bleeding with or without blood clots. Document Released: 11/13/2005 Document Revised: 02/05/2012 Document Reviewed: 04/09/2009 [facility] Patient Information 2014 [place], [place]. Diagnostic Laparoscopy Laparoscopy is a surgical procedure. It is used to diagnose and treat diseases inside the belly(abdomen). It is usually a brief, common, and relatively simple procedure. The laparoscopeis a thin, lighted, pencil-sized instrument. It is like a telescope. It is inserted into your abdomen through a small cut (incision). Your caregiver can look at the organs inside your body through this instrument. He or she can see if there is anything abnormal. Laparoscopy can be done either in a hospital or outpatient clinic. You may be given a mild sedative to help you relax before the procedure. Once in the operating room, you will be given a drug to make you sleep (general anesthesia). Laparoscopy usually lasts less than 1 hour. After the procedure, you will be monitored in a recovery area until you are stable and doing well. Once you are home, it will take 2 to 3 days to fully recover. RISKS AND COMPLICATIONS  Laparoscopy has relatively few risks. Your caregiver will discuss the risks with you before the procedure. Some problems that can occur  include:  Infection.  Bleeding.  Damage to other organs.  Anesthetic side effects. PROCEDURE Once you receive anesthesia, your surgeon inflates the abdomen with a harmless gas (carbon dioxide). This makes the organs easier to see. The laparoscope is inserted into the abdomen through a small incision. This allows your surgeon to see into the abdomen. Other small instruments are also inserted into the abdomen through other small openings. Many surgeons attach a video camera to the laparoscope to enlarge the view. During a diagnostic laparoscopy, the surgeon may be looking for inflammation, infection, or cancer. Your surgeon may take tissue samples(biopsies). The samples are sent to a specialist in looking at cells and tissue samples (pathologist). The pathologist examines them under a microscope. Biopsies can help to diagnose or confirm a disease. AFTER THE PROCEDURE   The gas is released from inside the abdomen.  The incisions are closed with stitches (sutures). Because these incisions are small (usually less than 1/2 inch), there is usually  minimal discomfort after the procedure. There may be some mild discomfort in the throat. This is from the tube placed in the throat while you were sleeping. You may have some mild abdominal discomfort. There may also be discomfort from the instrument placement incisions in the abdomen.  The recovery time is shortened as long as there are no complications.  You will rest in a recovery room until stable and doing well. As long as there are no complications, you may be allowed to go home. FINDING OUT THE RESULTS OF YOUR TEST Not all test results are available during your visit. If your test results are not back during the visit, make an appointment with your caregiver to find out the results. Do not assume everything is normal if you have not heard from your caregiver or the medical facility. It is important for you to follow up on all of your test  results. HOME CARE INSTRUCTIONS   Take all medicines as directed.  Only take over-the-counter or prescription medicines for pain, discomfort, or fever as directed by your caregiver.  Resume daily activities as directed.  Showers are preferred over baths.  You may resume sexual activities in 1 week or as directed.  Do not drive while taking narcotics. SEEK MEDICAL CARE IF:   There is increasing abdominal pain.  There is new pain in the shoulders (shoulder strap areas).  You feel lightheaded or faint.  You have the chills.  You or your child has an oral temperature above 102 F (38.9 C).  There is pus-like (purulent) drainage from any of the wounds.  You are unable to pass gas or have a bowel movement.  You feel sick to your stomach (nauseous) or throw up (vomit). MAKE SURE YOU:   Understand these instructions.  Will watch your condition.  Will get help right away if you are not doing well or get worse. Document Released: 02/19/2001 Document Revised: 02/05/2012 Document Reviewed: 11/13/2007 [facility] Patient Information 2014 [place], [place].

## 2013-05-02 NOTE — Progress Notes (Signed)
  Subjective:    Patient ID: Sandy Cruz, female    DOB: June 14, 1981, 32 y.o.   MRN: [id number]  HPI  Initial visit.  Referred from [facility] fro pelvic pain.  She reports h/o dysmenorrhea.  After birth of first child, on OC's until recent birth of last child.  Increasing pain at that time and heavy menses.  She underwent laparoscopy, followed by [facility] in 2003 with Dr. Greggory Keen with ovarian preservation.  Better for a while, but pain recurred.  Recent U/S revealed 4 cm ovarian cyst.  She was placed on OC's, but her pain remains constant and she reports severe pain, being doubled over.  She is requiring multiple doses of 7.5 mg oxycodone daily.  She was getting this from Dr. Glenis Smoker, but he would prefer for me to write this for her.  She is now wanting oophorectomy. Past Medical History  Diagnosis Date  . Ovarian cyst   . Endometriosis   . Anxiety    Past Surgical History  Procedure Laterality Date  . Vaginal hysterectomy  06/17/2012  . Urethra surgery    . Tonsillectomy and adenoidectomy    . Pelvic laparoscopy     Allergies  Allergen Reactions  . Hydrocodone Itching   Family History  Problem Relation Age of Onset  . Ovarian cysts Mother   . Cervical cancer Mother   . Stomach cancer Maternal Grandmother   . Lung cancer Maternal Grandfather   . Heart disease Brother   . Lung cancer Maternal Uncle    History   Social History  . Marital Status: Single    Spouse Name: N/A    Number of Children: N/A  . Years of Education: N/A   Occupational History  . Not on file.   Social History Main Topics  . Smoking status: Current Every Day Smoker -- 1.00 packs/day for 12 years    Types: Cigarettes  . Smokeless tobacco: Never Used  . Alcohol Use: Yes  . Drug Use: No  . Sexually Active: Yes    Birth Control/ Protection: Surgical   Other Topics Concern  . Not on file   Social History Narrative  . No narrative on file     Review of Systems  Eyes:  Negative for visual disturbance.  Respiratory: Negative for shortness of breath.   Cardiovascular: Negative for chest pain.  Gastrointestinal: Positive for rectal pain. Negative for abdominal pain.  Genitourinary: Positive for dyspareunia. Negative for menstrual problem.  Musculoskeletal: Negative for arthralgias.  Skin: Negative for rash.       Objective:   Physical Exam  Vitals reviewed. Constitutional: She is oriented to person, place, and time. She appears well-developed and well-nourished. No distress.  HENT:  Head: Normocephalic and atraumatic.  Eyes: No scleral icterus.  Neck: Neck supple.  Cardiovascular: Normal rate and regular rhythm.   Pulmonary/Chest: Effort normal. She exhibits no tenderness.  Abdominal: Soft. She exhibits no mass. There is no tenderness.  Genitourinary: Vagina normal.  Uterus is surgically absent.  There is no adnexal mass palpable, she has diffuse tenderness in her pelvis.  Neurological: She is alert and oriented to person, place, and time.  Skin: Skin is warm and dry.  Psychiatric:  Tearful          Assessment & Plan:

## 2013-05-02 NOTE — Assessment & Plan Note (Signed)
Advised that this may not completely take pain away, risks of surgery discussed at length.

## 2013-05-02 NOTE — Assessment & Plan Note (Signed)
Chronic pain.  Per pt, has diagnosis of endometriosis and she would like BSO.  I have tentatively scheduled this, she would like to wait until August.  We will be giving her pain meds until that time.  I would like to review her records from previous doctors, including her most recent u/s report.  We will request records.  She will be changing doctors, I am not sure why.

## 2013-05-12 ENCOUNTER — Telehealth: Payer: Self-pay | Admitting: *Deleted

## 2013-05-12 DIAGNOSIS — N809 Endometriosis, unspecified: Secondary | ICD-10-CM

## 2013-05-12 NOTE — Telephone Encounter (Signed)
I gave her 90,10 days ago, she should not be close to out yet!

## 2013-05-12 NOTE — Telephone Encounter (Signed)
Patient is requesting a refill of her Percocet as she is going out of town Wednesday and will run out while she is gone.  Last given on the 6th of June.

## 2013-05-14 MED ORDER — OXYCODONE-ACETAMINOPHEN 7.5-325 MG PO TABS: 1.0000 | ORAL_TABLET | Freq: Four times a day (QID) | ORAL | Status: DC | PRN

## 2013-05-14 NOTE — Telephone Encounter (Signed)
Dr. Shawnie Pons authorized 30 additional.  Patient picked up rx and discussed importance of using medication sparingly.

## 2013-05-14 NOTE — Telephone Encounter (Signed)
She is going to be out of town until July the 5th.  She feels like she will need them before this.

## 2013-05-14 NOTE — Addendum Note (Signed)
Addended by: Barbara Cower on: 05/14/2013 02:27 PM   Modules accepted: Orders

## 2013-05-29 ENCOUNTER — Telehealth: Payer: Self-pay

## 2013-05-29 NOTE — Telephone Encounter (Signed)
Patient is calling again about pain medicine. She is almost out and want to know if she can have another refill. She only has medicine till Monday. I advised from previous note that she was suppose to spread her meds out and not take so much a day. What would you like to do?  I told her you might not approve more so, let me know what you want me to do. Her surgery is scheduled in August.

## 2013-05-29 NOTE — Telephone Encounter (Signed)
You can fill for 45.  We still have not gotten records or U/S that I have seen.  This needs attention ASAP.  I want these records prior to surgery!

## 2013-06-02 ENCOUNTER — Other Ambulatory Visit: Payer: Self-pay | Admitting: Family Medicine

## 2013-06-02 DIAGNOSIS — N809 Endometriosis, unspecified: Secondary | ICD-10-CM

## 2013-06-02 MED ORDER — OXYCODONE-ACETAMINOPHEN 7.5-325 MG PO TABS: 1.0000 | ORAL_TABLET | Freq: Four times a day (QID) | ORAL | Status: DC | PRN

## 2013-07-02 ENCOUNTER — Telehealth: Payer: Self-pay | Admitting: *Deleted

## 2013-07-02 NOTE — Telephone Encounter (Signed)
Patients mother called with patient sitting next to her stating that she was in severe pain and already scheduled for her surgery.  She is out of pain medication and says that the amount called in was only enough for one pill a day.  She is wanting to know if she can pick something up to get her through until her surgery.

## 2013-07-03 NOTE — Telephone Encounter (Signed)
From what I can tell she has had 165 pills since 6/5.  That is approx. 3 pills/day.  You can send in 45 now.

## 2013-07-04 ENCOUNTER — Telehealth: Payer: Self-pay | Admitting: *Deleted

## 2013-07-04 DIAGNOSIS — N809 Endometriosis, unspecified: Secondary | ICD-10-CM

## 2013-07-04 MED ORDER — OXYCODONE-ACETAMINOPHEN 7.5-325 MG PO TABS: 1.0000 | ORAL_TABLET | Freq: Four times a day (QID) | ORAL | Status: DC | PRN

## 2013-07-04 NOTE — Telephone Encounter (Signed)
Patient has surgery scheduled with Dr. Shawnie Pons and is in need of a refill of her pain medication.  Dr. Shawnie Pons has agreed to give her one refill of her percocet and Dr. Marice Potter has agreed to write this for the patient as she is in the clinic today.  Patient understands there will be no additional refills prior to her surgery.

## 2013-07-21 ENCOUNTER — Encounter (HOSPITAL_COMMUNITY): Payer: Self-pay | Admitting: *Deleted

## 2013-07-21 ENCOUNTER — Ambulatory Visit (HOSPITAL_COMMUNITY)
Admission: RE | Admit: 2013-07-21 | Discharge: 2013-07-21 | Disposition: A | Discharge status: Home / Self Care | Payer: Medicaid Other | Source: Ambulatory Visit | Attending: Family Medicine | Admitting: Family Medicine

## 2013-07-21 ENCOUNTER — Encounter (HOSPITAL_COMMUNITY)
Admission: RE | Disposition: A | Discharge status: Home / Self Care | Payer: Self-pay | Source: Ambulatory Visit | Attending: Family Medicine

## 2013-07-21 ENCOUNTER — Encounter (HOSPITAL_COMMUNITY): Payer: Self-pay | Admitting: Anesthesiology

## 2013-07-21 ENCOUNTER — Ambulatory Visit (HOSPITAL_COMMUNITY): Payer: Medicaid Other | Admitting: Anesthesiology

## 2013-07-21 DIAGNOSIS — Z90710 Acquired absence of both cervix and uterus: Secondary | ICD-10-CM | POA: Insufficient documentation

## 2013-07-21 DIAGNOSIS — N809 Endometriosis, unspecified: Secondary | ICD-10-CM | POA: Insufficient documentation

## 2013-07-21 DIAGNOSIS — N949 Unspecified condition associated with female genital organs and menstrual cycle: Secondary | ICD-10-CM | POA: Insufficient documentation

## 2013-07-21 DIAGNOSIS — R102 Pelvic and perineal pain: Secondary | ICD-10-CM

## 2013-07-21 HISTORY — DX: Headache: R51

## 2013-07-21 HISTORY — PX: SALPINGOOPHORECTOMY: SHX82

## 2013-07-21 HISTORY — PX: LAPAROSCOPY: SHX197

## 2013-07-21 LAB — CBC
HCT: 38.1 % (ref 36.0–46.0)
Hemoglobin: 13.5 g/dL (ref 12.0–15.0)
MCH: 31 pg (ref 26.0–34.0)
MCHC: 35.4 g/dL (ref 30.0–36.0)
MCV: 87.6 fL (ref 78.0–100.0)
Platelets: 220 10*3/uL (ref 150–400)
RBC: 4.35 MIL/uL (ref 3.87–5.11)
RDW: 13 % (ref 11.5–15.5)
WBC: 8.4 10*3/uL (ref 4.0–10.5)

## 2013-07-21 SURGERY — LAPAROSCOPY OPERATIVE
Anesthesia: General | Site: Abdomen | Wound class: Clean Contaminated

## 2013-07-21 MED ORDER — CEFAZOLIN SODIUM-DEXTROSE 2-3 GM-% IV SOLR
INTRAVENOUS | Status: AC
  Filled 2013-07-21: qty 50

## 2013-07-21 MED ORDER — MIDAZOLAM HCL 2 MG/2ML IJ SOLN
INTRAMUSCULAR | Status: AC
  Filled 2013-07-21: qty 2

## 2013-07-21 MED ORDER — GLYCOPYRROLATE 0.2 MG/ML IJ SOLN
INTRAMUSCULAR | Status: DC | PRN
  Administered 2013-07-21: 0.4 mg via INTRAVENOUS

## 2013-07-21 MED ORDER — BUPIVACAINE HCL (PF) 0.25 % IJ SOLN
INTRAMUSCULAR | Status: DC | PRN
  Administered 2013-07-21: 12 mL

## 2013-07-21 MED ORDER — OXYCODONE-ACETAMINOPHEN 5-325 MG PO TABS: 1.0000 | ORAL_TABLET | Freq: Four times a day (QID) | ORAL | Status: DC | PRN

## 2013-07-21 MED ORDER — DEXAMETHASONE SODIUM PHOSPHATE 10 MG/ML IJ SOLN
INTRAMUSCULAR | Status: AC
  Filled 2013-07-21: qty 1

## 2013-07-21 MED ORDER — NEOSTIGMINE METHYLSULFATE 1 MG/ML IJ SOLN
INTRAMUSCULAR | Status: DC | PRN
  Administered 2013-07-21: 2 mg via INTRAVENOUS

## 2013-07-21 MED ORDER — FENTANYL CITRATE 0.05 MG/ML IJ SOLN
INTRAMUSCULAR | Status: AC
  Filled 2013-07-21: qty 5

## 2013-07-21 MED ORDER — PROPOFOL 10 MG/ML IV EMUL
INTRAVENOUS | Status: AC
  Filled 2013-07-21: qty 20

## 2013-07-21 MED ORDER — KETOROLAC TROMETHAMINE 30 MG/ML IJ SOLN
INTRAMUSCULAR | Status: DC | PRN
  Administered 2013-07-21: 30 mg via INTRAVENOUS

## 2013-07-21 MED ORDER — CEFAZOLIN SODIUM-DEXTROSE 2-3 GM-% IV SOLR
2.0000 g | INTRAVENOUS | Status: AC
  Administered 2013-07-21: 2 g via INTRAVENOUS

## 2013-07-21 MED ORDER — HYDROMORPHONE HCL PF 1 MG/ML IJ SOLN
INTRAMUSCULAR | Status: AC
  Filled 2013-07-21: qty 1

## 2013-07-21 MED ORDER — FENTANYL CITRATE 0.05 MG/ML IJ SOLN
INTRAMUSCULAR | Status: DC | PRN
  Administered 2013-07-21: 50 ug via INTRAVENOUS
  Administered 2013-07-21 (×2): 100 ug via INTRAVENOUS

## 2013-07-21 MED ORDER — MEPERIDINE HCL 25 MG/ML IJ SOLN: 6.2500 mg | INTRAMUSCULAR | Status: DC | PRN

## 2013-07-21 MED ORDER — OXYCODONE-ACETAMINOPHEN 5-325 MG PO TABS
ORAL_TABLET | ORAL | Status: AC
  Administered 2013-07-21: 1 via ORAL
  Filled 2013-07-21: qty 1

## 2013-07-21 MED ORDER — ONDANSETRON HCL 4 MG/2ML IJ SOLN
INTRAMUSCULAR | Status: DC | PRN
  Administered 2013-07-21: 4 mg via INTRAVENOUS

## 2013-07-21 MED ORDER — ROCURONIUM BROMIDE 50 MG/5ML IV SOLN
INTRAVENOUS | Status: AC
  Filled 2013-07-21: qty 1

## 2013-07-21 MED ORDER — ROCURONIUM BROMIDE 100 MG/10ML IV SOLN
INTRAVENOUS | Status: DC | PRN
  Administered 2013-07-21: 10 mg via INTRAVENOUS
  Administered 2013-07-21: 30 mg via INTRAVENOUS

## 2013-07-21 MED ORDER — HYDROMORPHONE HCL PF 1 MG/ML IJ SOLN
INTRAMUSCULAR | Status: AC
  Administered 2013-07-21: 1 mg via INTRAVENOUS
  Filled 2013-07-21: qty 1

## 2013-07-21 MED ORDER — BUPIVACAINE HCL (PF) 0.25 % IJ SOLN
INTRAMUSCULAR | Status: AC
  Filled 2013-07-21: qty 30

## 2013-07-21 MED ORDER — LACTATED RINGERS IV SOLN: INTRAVENOUS | Status: DC

## 2013-07-21 MED ORDER — PROPOFOL 10 MG/ML IV BOLUS
INTRAVENOUS | Status: DC | PRN
  Administered 2013-07-21: 180 mg via INTRAVENOUS

## 2013-07-21 MED ORDER — DEXAMETHASONE SODIUM PHOSPHATE 10 MG/ML IJ SOLN
INTRAMUSCULAR | Status: DC | PRN
  Administered 2013-07-21: 10 mg via INTRAVENOUS

## 2013-07-21 MED ORDER — ONDANSETRON HCL 4 MG/2ML IJ SOLN
INTRAMUSCULAR | Status: AC
  Filled 2013-07-21: qty 2

## 2013-07-21 MED ORDER — LIDOCAINE HCL (CARDIAC) 20 MG/ML IV SOLN
INTRAVENOUS | Status: AC
  Filled 2013-07-21: qty 5

## 2013-07-21 MED ORDER — KETOROLAC TROMETHAMINE 30 MG/ML IJ SOLN
INTRAMUSCULAR | Status: AC
  Filled 2013-07-21: qty 1

## 2013-07-21 MED ORDER — HYDROMORPHONE HCL PF 1 MG/ML IJ SOLN
0.2500 mg | INTRAMUSCULAR | Status: DC | PRN
  Administered 2013-07-21: 1 mg via INTRAVENOUS

## 2013-07-21 MED ORDER — LIDOCAINE HCL (CARDIAC) 20 MG/ML IV SOLN
INTRAVENOUS | Status: DC | PRN
  Administered 2013-07-21: 50 mg via INTRAVENOUS

## 2013-07-21 MED ORDER — OXYCODONE-ACETAMINOPHEN 5-325 MG PO TABS
1.0000 | ORAL_TABLET | ORAL | Status: DC | PRN
  Administered 2013-07-21: 1 via ORAL

## 2013-07-21 MED ORDER — LACTATED RINGERS IV SOLN
INTRAVENOUS | Status: DC
  Administered 2013-07-21: 15:00:00 via INTRAVENOUS

## 2013-07-21 MED ORDER — MIDAZOLAM HCL 5 MG/5ML IJ SOLN
INTRAMUSCULAR | Status: DC | PRN
  Administered 2013-07-21: 2 mg via INTRAVENOUS

## 2013-07-21 MED ORDER — METOCLOPRAMIDE HCL 5 MG/ML IJ SOLN: 10.0000 mg | Freq: Once | INTRAMUSCULAR | Status: DC | PRN

## 2013-07-21 SURGICAL SUPPLY — 28 items
CABLE HIGH FREQUENCY MONO STRZ (ELECTRODE) IMPLANT
CATH ROBINSON RED A/P 16FR (CATHETERS) IMPLANT
CHLORAPREP W/TINT 26ML (MISCELLANEOUS) ×3 IMPLANT
CLOTH BEACON ORANGE TIMEOUT ST (SAFETY) ×3 IMPLANT
DERMABOND ADHESIVE PROPEN (GAUZE/BANDAGES/DRESSINGS) ×1
DERMABOND ADVANCED .7 DNX6 (GAUZE/BANDAGES/DRESSINGS) ×2 IMPLANT
FORCEPS CUTTING 33CM 5MM (CUTTING FORCEPS) IMPLANT
FORCEPS CUTTING 45CM 5MM (CUTTING FORCEPS) IMPLANT
GLOVE BIOGEL PI IND STRL 7.0 (GLOVE) ×6 IMPLANT
GLOVE BIOGEL PI INDICATOR 7.0 (GLOVE) ×3
GLOVE ECLIPSE 7.0 STRL STRAW (GLOVE) ×6 IMPLANT
GOWN PREVENTION PLUS LG XLONG (DISPOSABLE) ×6 IMPLANT
GOWN PREVENTION PLUS XLARGE (GOWN DISPOSABLE) ×3 IMPLANT
NS IRRIG 1000ML POUR BTL (IV SOLUTION) IMPLANT
PACK LAPAROSCOPY BASIN (CUSTOM PROCEDURE TRAY) ×3 IMPLANT
POUCH SPECIMEN RETRIEVAL 10MM (ENDOMECHANICALS) IMPLANT
PROTECTOR NERVE ULNAR (MISCELLANEOUS) ×3 IMPLANT
SET IRRIG TUBING LAPAROSCOPIC (IRRIGATION / IRRIGATOR) IMPLANT
SUT VIC AB 3-0 X1 27 (SUTURE) ×3 IMPLANT
SUT VICRYL 0 UR6 27IN ABS (SUTURE) ×6 IMPLANT
SUT VICRYL 4-0 PS2 18IN ABS (SUTURE) ×3 IMPLANT
TOWEL OR 17X24 6PK STRL BLUE (TOWEL DISPOSABLE) ×6 IMPLANT
TRAY FOLEY CATH 14FR (SET/KITS/TRAYS/PACK) ×3 IMPLANT
TROCAR BALLN 12MMX100 BLUNT (TROCAR) ×3 IMPLANT
TROCAR OPTI TIP 5M 100M (ENDOMECHANICALS) ×6 IMPLANT
TROCAR XCEL DIL TIP R 11M (ENDOMECHANICALS) IMPLANT
WARMER LAPAROSCOPE (MISCELLANEOUS) ×3 IMPLANT
WATER STERILE IRR 1000ML POUR (IV SOLUTION) ×3 IMPLANT

## 2013-07-21 NOTE — Anesthesia Postprocedure Evaluation (Signed)
Anesthesia Post Note  Patient: Sandy Cruz  Procedure(s) Performed: Procedure(s) (LRB): LAPAROSCOPY OPERATIVE (N/A) SALPINGO OOPHORECTOMY  Bilateral (Bilateral)  Anesthesia type: General  Patient location: PACU  Post pain: Pain level controlled  Post assessment: Post-op Vital signs reviewed  Last Vitals:  Filed Vitals:   07/21/13 1700  BP:   Pulse: 58  Temp:   Resp: 15    Post vital signs: Reviewed  Level of consciousness: sedated  Complications: No apparent anesthesia complications

## 2013-07-21 NOTE — Anesthesia Preprocedure Evaluation (Signed)
Anesthesia Evaluation  Patient identified by MRN, date of birth, ID band Patient awake    Reviewed: Allergy & Precautions, H&P , NPO status , Patient's Chart, lab work & pertinent test results  Airway Mallampati: II TM Distance: >3 FB Neck ROM: Full    Dental no notable dental hx. (+) Teeth Intact   Pulmonary Current Smoker,  breath sounds clear to auscultation  Pulmonary exam normal       Cardiovascular negative cardio ROS  Rhythm:Regular Rate:Normal     Neuro/Psych Anxiety negative neurological ROS     GI/Hepatic negative GI ROS, Neg liver ROS,   Endo/Other  negative endocrine ROS  Renal/GU negative Renal ROS  negative genitourinary   Musculoskeletal   Abdominal   Peds  Hematology negative hematology ROS (+)   Anesthesia Other Findings   Reproductive/Obstetrics Endometriosis Pelvic Pain Ovarian Cyst                           Anesthesia Physical Anesthesia Plan  ASA: II  Anesthesia Plan: General   Post-op Pain Management:    Induction: Intravenous  Airway Management Planned: Oral ETT  Additional Equipment:   Intra-op Plan:   Post-operative Plan: Extubation in OR  Informed Consent: I have reviewed the patients History and Physical, chart, labs and discussed the procedure including the risks, benefits and alternatives for the proposed anesthesia with the patient or authorized representative who has indicated his/her understanding and acceptance.   Dental advisory given  Plan Discussed with: Anesthesiologist, CRNA and Surgeon  Anesthesia Plan Comments:         Anesthesia Quick Evaluation

## 2013-07-21 NOTE — Transfer of Care (Signed)
Immediate Anesthesia Transfer of Care Note  Patient: Sandy Cruz  Procedure(s) Performed: Procedure(s): LAPAROSCOPY OPERATIVE (N/A) SALPINGO OOPHORECTOMY  Bilateral (Bilateral)  Patient Location: PACU  Anesthesia Type:General  Level of Consciousness: awake  Airway & Oxygen Therapy: Patient Spontanous Breathing  Post-op Assessment: Report given to PACU RN  Post vital signs: stable  Filed Vitals:   07/21/13 1439  BP: 93/54  Pulse: 60  Temp: 36.6 C  Resp: 16    Complications: No apparent anesthesia complications

## 2013-07-21 NOTE — Op Note (Signed)
Sandy Cruz PROCEDURE DATE: 07/21/2013  PREOPERATIVE DIAGNOSIS: Pelvic pain and endometriosis  POSTOPERATIVE DIAGNOSIS: The same  PROCEDURE: Laparoscopic bilateral salpingo-ophorectomy  SURGEON:  Dr. Reva Bores  ASSISTANT: Dr. Nicholaus Bloom  ANESTHESIOLOGIST: Dr. Earl Gala Scharlene Corn., MD   INDICATIONS: 32 y.o. G2P2 with aforementioned preoprative diagnoses here today for definitive surgical management.   Risks of surgery were discussed with the patient including but not limited to: bleeding which may require transfusion or reoperation; infection which may require antibiotics; injury to bowel, bladder, ureters or other surrounding organs; need for additional procedures including laparotomy; thromboembolic phenomenon, incisional problems and other postoperative/anesthesia complications. Written informed consent was obtained.    FINDINGS: Absent uterus, bilateral adnexa with normal appearing ovaries.  Adhesions of the large right colon to the anterior abdominal wall.  Adhesions from the sigmoid to the left ovary. Some evidence of endometriosis. Normal upper abdomen.  ANESTHESIA:    General  ESTIMATED BLOOD LOSS: < 25 ml  SPECIMENS:  Bilateral ovaries and fallopian tubes  COMPLICATIONS: None immediate  PROCEDURE IN DETAIL:  The patient received intravenous antibiotics and had sequential compression devices applied to her lower extremities while in the preoperative area.  She was then taken to the operating room where general anesthesia was administered and was found to be adequate.  She was placed in the dorsal lithotomy position, and was prepped and draped in a sterile manner.  A Foley catheter was inserted into her bladder and attached to constant drainage.  After an adequate timeout was performed, attention was then turned to the abdomen. Four cc of 0.25% Marcaine was injected at the umbilicus. Two Allis clamps were used to tent up the skin of the umbilicus a vertical one half  centimeter incision was made here. The fascia was incised with the knife  And the peritoneum was entered sharply with this incision. Two edges of the fascia were tagged with a 0 Vicryl suture on a UR 6 needle. A Hassan trocar was placed through this incision and a pneumoperitoneum was created. The patient was then placed in Trendelenburg.  A survey of the patient'Cruz pelvis and abdomen revealed the findings above.   Bilateral 5-mm lower quadrant ports  were then placed under direct visualization.   On the right side, the infundibulopelvic ligament was then clamped and transected with the Harmonic device.  The tube and ovarian attachments to the side wall were excised. Excellent hemostasis was noted. The left ureter was identified.  The adhesions were taken down using the Harmonic scalpel on the left, to remove the sigmoid from the ovary.  The left infundibulopelvic ligament was taken with the Harmonic scalpel. The tube and ovarian attachments to the side wall were excised. The specimen was then removed from the abdomen through the 11-mm port under direct visualization.  The operative site was surveyed, and it was found to be hemostatic.  No intraoperative injury to other surrounding organs was noted.  The abdomen was desufflated and all instruments were then removed from the patient'Cruz abdomen. The fascial incision of the umbilicus was closed with the afore-mentioned 0 Vicryl on UR-6 in 2 figure of eights.  All skin incisions were closed with 3-0 Vicryl subcuticular stitches/Dermabond.   The patient will be discharged to home as per PACU criteria.  Routine postoperative instructions given.  She was prescribed Percocet. She will follow up in the clinic in 2 wks for postoperative evaluation.  Sandy Cruz 07/21/2013

## 2013-07-21 NOTE — H&P (Signed)
Sandy Cruz is an 32 y.o. G2P2 Unknown female.   Chief Complaint: Pelvic pain  HPI: Pt. With long h/o pelvic pain.  S/p laparoscopy and TVH for "endometriosis" according to pt.  Still have not seen pathology report that verifies diagnosis.  Pain has continued and she is using at least 3 oxycodone daily. Additionally she is on Klonopin, flexeril and Lodine.  Her pain continues and she desires treatment with BSO.  Past Medical History  Diagnosis Date  . Ovarian cyst   . Endometriosis   . Anxiety   . Headache(784.0)     pinched nerves in neck    Past Surgical History  Procedure Laterality Date  . Vaginal hysterectomy  06/17/2012  . Urethra surgery    . Tonsillectomy and adenoidectomy    . Pelvic laparoscopy    . Vaginal delivery  1997. 2011    Family History  Problem Relation Age of Onset  . Ovarian cysts Mother   . Cervical cancer Mother   . Stomach cancer Maternal Grandmother   . Lung cancer Maternal Grandfather   . Heart disease Brother   . Lung cancer Maternal Uncle    Social History:  reports that she has been smoking Cigarettes.  She has a 12 pack-year smoking history. She has never used smokeless tobacco. She reports that  drinks alcohol. She reports that she does not use illicit drugs.  Allergies  Allergen Reactions  . Hydrocodone Itching    Medications Prior to Admission  Medication Sig Dispense Refill  . clonazePAM (KLONOPIN) 0.5 MG tablet Take 0.5 mg by mouth 2 (two) times daily as needed for anxiety.      . cyclobenzaprine (FLEXERIL) 10 MG tablet Take 10 mg by mouth 3 (three) times daily as needed for muscle spasms.      Marland Kitchen etodolac (LODINE) 400 MG tablet Take 400 mg by mouth 2 (two) times daily.      Marland Kitchen oxyCODONE-acetaminophen (PERCOCET) 7.5-325 MG per tablet Take 1 tablet by mouth every 6 (six) hours as needed for pain.  45 tablet  0     Pertinent items are noted in HPI.  Blood pressure 93/54, pulse 60, temperature 97.9 F (36.6 C), temperature  source Oral, resp. rate 16, height 5\' 3"  (1.6 m), weight 125 lb (56.7 kg), SpO2 99.00%. BP 93/54  Pulse 60  Temp(Src) 97.9 F (36.6 C) (Oral)  Resp 16  Ht 5\' 3"  (1.6 m)  Wt 125 lb (56.7 kg)  BMI 22.15 kg/m2  SpO2 99% General appearance: alert, cooperative and appears stated age Head: Normocephalic, without obvious abnormality, atraumatic Neck: supple, symmetrical, trachea midline Lungs: clear to auscultation bilaterally Heart: regular rate and rhythm Abdomen: soft, non-tender; bowel sounds normal; no masses,  no organomegaly Extremities: extremities normal, atraumatic, no cyanosis or edema Skin: Skin color, texture, turgor normal. No rashes or lesions Neurologic: Grossly normal   Lab Results  Component Value Date   WBC 8.4 07/21/2013   HGB 13.5 07/21/2013   HCT 38.1 07/21/2013   MCV 87.6 07/21/2013   PLT 220 07/21/2013   No results found for this basename: PREGTESTUR, PREGSERUM, HCG, HCGQUANT     Assessment/Plan Patient Active Problem List   Diagnosis Date Noted  . Pelvic pain 05/01/2013  . Endometriosis 05/01/2013   For laparoscopic removal of bilateral tubes and ovaries. A discussion of  Risks/benefits and alternatives were discussed in detail with the patient and her mother on at least 2 separate occasions, including just prior to the procedure.  These risks  include but are not limited to bleeding, infection, injury to surrounding structures, including bowel, bladder and ureters, blood clots, death, and continued pain.  Likelihood of success is medium.   Sandy Cruz S 07/21/2013, 3:09 PM

## 2013-07-22 ENCOUNTER — Ambulatory Visit (INDEPENDENT_AMBULATORY_CARE_PROVIDER_SITE_OTHER): Payer: Medicaid Other | Admitting: Family Medicine

## 2013-07-22 ENCOUNTER — Encounter (HOSPITAL_COMMUNITY): Payer: Self-pay | Admitting: Family Medicine

## 2013-07-22 VITALS — BP 127/77 | HR 98 | Temp 98.7°F | Ht 63.0 in | Wt 125.0 lb

## 2013-07-22 DIAGNOSIS — G8918 Other acute postprocedural pain: Secondary | ICD-10-CM

## 2013-07-22 MED ORDER — DICLOFENAC SODIUM 75 MG PO TBEC: 75.0000 mg | DELAYED_RELEASE_TABLET | Freq: Two times a day (BID) | ORAL | Status: DC

## 2013-07-22 NOTE — Assessment & Plan Note (Signed)
Discussed normal post-op pain.  Advised to return with fever > 101, or persistent N/V.  Will add NSAIDS for synergistic effect.

## 2013-07-22 NOTE — Progress Notes (Signed)
  Subjective:    Patient ID: Sandy Cruz, female    DOB: 19-Jul-1981, 32 y.o.   MRN: [id number]  HPI  Pt. Is s/p laparoscopic BSO yesterday.  Pt's mother called today with fever to 100.1.  She reports too much pain with taking 2 percocet every 4 hours. Needs something more for pain. No nausea, vomiting, urination is WNL.  Has some shoulder pain.   Review of Systems  HENT: Negative for congestion.   Respiratory: Negative for cough.   Gastrointestinal: Negative for nausea, vomiting and abdominal pain.  Genitourinary: Negative for dysuria.       Objective:   Physical Exam  Vitals reviewed. Constitutional: She is oriented to person, place, and time. She appears well-developed and well-nourished.  HENT:  Head: Normocephalic and atraumatic.  Neck: Neck supple.  Cardiovascular: Normal rate.   Pulmonary/Chest: Effort normal.  Abdominal: Soft. There is no tenderness.  Neurological: She is alert and oriented to person, place, and time.  Skin: Skin is warm and dry.          Assessment & Plan:

## 2013-07-22 NOTE — Patient Instructions (Signed)
Pain Relief Preoperatively and Postoperatively °Being a good patient does not mean being a silent one. If you have questions, problems, or concerns about the pain you may feel after surgery, let your caregiver know. Patients have the right to assessment and management of pain. The treatment of pain after surgery is important to speed up recovery and return to normal activities. Severe pain after surgery, and the fear or anxiety associated with that pain, may cause extreme discomfort that: °· Prevents sleep. °· Decreases the ability to breathe deeply and cough. This can cause pneumonia or other upper airway infections. °· Causes your heart to beat faster and your blood pressure to be higher. °· Increases the risk for constipation and bloating. °· Decreases the ability of wounds to heal. °· May result in depression, increased anxiety, and feelings of helplessness. °Relief of pain before surgery is also important because it will lessen the pain after surgery. Patients who receive both pain relief before and after surgery experience greater pain relief than those who only receive pain relief after surgery. Let your caregiver know if you are having uncontrolled pain. This is very important. Pain after surgery is more difficult to manage if it is permitted to become severe, so prompt and adequate treatment of acute pain is necessary. °PAIN CONTROL METHODS °Your caregivers follow policies and procedures about the management of patient pain. These guidelines should be explained to you before surgery. Plans for pain control after surgery must be mutually decided upon and instituted with your full understanding and agreement. Do not be afraid to ask questions regarding the care you are receiving. There are many different ways your caregivers will attempt to control your pain, including the following methods. °As needed pain control °· You may be given pain medicine either through your intravenous (IV) tube, or as a pill or  liquid you can swallow. You will need to let your caregiver know when you are having pain. Then, your caregiver will give you the pain medicine ordered for you. °· Your pain medicine may make you constipated. If constipation occurs, drink more liquids if you can. Your caregiver may have you take a mild laxative. °IV patient-controlled analgesia pump (PCA pump) °· You can get your pain medicine through the IV tube which goes into your vein. You are able to control the amount of pain medicine that you get. The pain medicine flows in through an IV tube and is controlled by a pump. This pump gives you a set amount of pain medicine when you push the button hooked up to it. Nobody should push this button but you or someone specifically assigned by you to do so. It is set up to keep you from accidentally giving yourself too much pain medicine. You will be able to start using your pain pump in the recovery room after your surgery. This method can be helpful for most types of surgery. °· If you are still having too much pain, tell your caregiver. Also, tell your caregiver if you are feeling too sleepy or nauseous. °Continuous epidural pain control °· A thin, soft tube (catheter) is put into your back. Pain medicine flows through the catheter to lessen pain in the part of your body where the surgery is done. Continuous epidural pain control may work best for you if you are having surgery on your chest, abdomen, hip area, or legs. The epidural catheter is usually put into your back just before surgery. The catheter is left in until you can eat and take medicine by mouth. In most cases,   this may take 2 to 3 days. °· Giving pain medicine through the epidural catheter may help you heal faster because: °· Your bowel gets back to normal faster. °· You can get back to eating sooner. °· You can be up and walking sooner. °Medicine that numbs the area (local anesthetic) °· You may receive an injection of pain medicine near where the  pain is (local infiltration). °· You may receive an injection of pain medicine near the nerve that controls the sensation to a specific part of the body (peripheral nerve block). °· Medicine may be put in the spine to block pain (spinal block). °Opioids °· Moderate to moderately severe acute pain after surgery may respond to opioids. Opioids are narcotic pain medicine. Opioids are often combined with non-narcotic medicines to improve pain relief, diminish the risk of side effects, and reduce the chance of addiction. °· If you follow your caregiver's directions about taking opioids and you do not have a history of substance abuse, your risk of becoming addicted is exceptionally small. Opioids are given for short periods of time in careful doses to prevent addiction. °Other methods of pain control include: °· Steroids. °· Physical therapy. °· Heat and cold therapy. °· Compression, such as wrapping an elastic bandage around the area of pain. °· Massage. °These various ways of controlling pain may be used together. Combining different methods of pain control is called multimodal analgesia. Using this approach has many benefits, including being able to eat, move around, and leave the hospital sooner. °Document Released: 02/03/2003 Document Revised: 02/05/2012 Document Reviewed: 02/07/2011 °[employer]® Patient Information ©2014 [employer]. ° °

## 2013-07-22 NOTE — Progress Notes (Signed)
Pt feels like airway is "constricted" and pt is having to work harder to breath.  Pt reports having a fever this morning.

## 2013-07-24 ENCOUNTER — Telehealth: Payer: Self-pay | Admitting: *Deleted

## 2013-07-24 NOTE — Telephone Encounter (Signed)
Patients mother called stating that the patient was still in pain and will run out of pain medication before she is due to come back to the office.  I explained that Dr. Shawnie Pons would be back in the office tomorrow and I would get a message to her and let her know what is going on.  I told patient that we would call her back tomorrow with more information.

## 2013-07-25 ENCOUNTER — Other Ambulatory Visit: Payer: Self-pay | Admitting: Family Medicine

## 2013-07-25 MED ORDER — OXYCODONE-ACETAMINOPHEN 5-325 MG PO TABS: 1.0000 | ORAL_TABLET | Freq: Four times a day (QID) | ORAL | Status: DC | PRN

## 2013-07-29 ENCOUNTER — Encounter: Payer: Self-pay | Admitting: Family Medicine

## 2013-07-29 ENCOUNTER — Ambulatory Visit (INDEPENDENT_AMBULATORY_CARE_PROVIDER_SITE_OTHER): Payer: Medicaid Other | Admitting: Family Medicine

## 2013-07-29 VITALS — BP 117/71 | HR 65 | Ht 63.0 in | Wt 124.0 lb

## 2013-07-29 DIAGNOSIS — R102 Pelvic and perineal pain: Secondary | ICD-10-CM

## 2013-07-29 DIAGNOSIS — Z09 Encounter for follow-up examination after completed treatment for conditions other than malignant neoplasm: Secondary | ICD-10-CM

## 2013-07-29 DIAGNOSIS — N949 Unspecified condition associated with female genital organs and menstrual cycle: Secondary | ICD-10-CM

## 2013-07-29 DIAGNOSIS — E8940 Asymptomatic postprocedural ovarian failure: Secondary | ICD-10-CM

## 2013-07-29 DIAGNOSIS — E8941 Symptomatic postprocedural ovarian failure: Secondary | ICD-10-CM

## 2013-07-29 MED ORDER — GABAPENTIN 300 MG PO CAPS: 300.0000 mg | ORAL_CAPSULE | Freq: Three times a day (TID) | ORAL | Status: DC

## 2013-07-29 NOTE — Progress Notes (Signed)
  Subjective:    Patient ID: Sandy Cruz, female    DOB: 10-Oct-1981, 32 y.o.   MRN: [id number]  HPI  Here for postop following Laparoscopic BSO.  She is having less pain.  Has had 84 Percocets surgery.  Reports having enough to get through the weekend.  Still having similar pain as prior to surgery.  Voltaren did not help. (made her sleepy).   Review of Systems  Constitutional: Negative for chills.       Hot flashes and night sweats.       Objective:   Physical Exam  Vitals reviewed. Constitutional: She appears well-developed and well-nourished.  HENT:  Head: Normocephalic and atraumatic.  Eyes: No scleral icterus.  Cardiovascular: Normal rate.   Abdominal: Soft. There is no tenderness.  Skin:  Well healed incision sites.          Assessment & Plan:

## 2013-07-29 NOTE — Assessment & Plan Note (Signed)
As discussed prior to surgery--pain may not improve with surgery--will begin Gabapentin with slow taper up in dose.  If pain continues will need pain management referral--discussed with pt. At length.  After post-op period of 4 wks will no longer give pt. Narcotics.

## 2013-07-29 NOTE — Patient Instructions (Addendum)
Menopause Menopause is the normal time of life when menstrual periods stop completely. Menopause is complete when you have missed 12 consecutive menstrual periods. It usually occurs between the ages of 48 to 55, with an average age of 51. Very rarely does a woman develop menopause before 33 years old. At menopause, your ovaries stop producing the female hormones, estrogen and progesterone. This can cause undesirable symptoms and also affect your health. Sometimes the symptoms may occur 4 to 5 years before the menopause begins. There is no relationship between menopause and:  Oral contraceptives.  Number of children you had.  Race.  The age your menstrual periods started (menarche). Heavy smokers and very thin women may develop menopause earlier in life. CAUSES  The ovaries stop producing the female hormones estrogen and progesterone.  Other causes include:  Surgery to remove both ovaries.  The ovaries stop functioning for no known reason.  Tumors of the pituitary gland in the brain.  Medical disease that affects the ovaries and hormone production.  Radiation treatment to the abdomen or pelvis.  Chemotherapy that affects the ovaries. SYMPTOMS   Hot flashes.  Night sweats.  Decrease in sex drive.  Vaginal dryness and thinning of the vagina causing painful intercourse.  Dryness of the skin and developing wrinkles.  Headaches.  Tiredness.  Irritability.  Memory problems.  Weight gain.  Bladder infections.  Hair growth of the face and chest.  Infertility. More serious symptoms include:  Loss of bone (osteoporosis) causing breaks (fractures).  Depression.  Hardening and narrowing of the arteries (atherosclerosis) causing heart attacks and strokes. DIAGNOSIS   When the menstrual periods have stopped for 12 straight months.  Physical exam.  Hormone studies of the blood. TREATMENT  There are many treatment choices and nearly as many questions about them.  The decisions to treat or not to treat menopausal changes is an individual choice made with your caregiver. Your caregiver can discuss the treatments with you. Together, you can decide which treatment will work best for you. Your treatment choices may include:   Hormone therapy (estorgen and progesterone).  Non-hormonal medications.  Treating the individual symptoms with medication (for example antidepressants for depression).  Herbal medications that may help specific symptoms.  Counseling by a psychiatrist or psychologist.  Group therapy.  Lifestyle changes including:  Eating healthy.  Regular exercise.  Limiting caffeine and alcohol.  Stress management and meditation.  No treatment. HOME CARE INSTRUCTIONS   Take the medication your caregiver gives you as directed.  Get plenty of sleep and rest.  Exercise regularly.  Eat a diet that contains calcium (good for the bones) and soy products (acts like estrogen hormone).  Avoid alcoholic beverages.  Do not smoke.  If you have hot flashes, dress in layers.  Take supplements, calcium and vitamin D to strengthen bones.  You can use over-the-counter lubricants or moisturizers for vaginal dryness.  Group therapy is sometimes very helpful.  Acupuncture may be helpful in some cases. SEEK MEDICAL CARE IF:   You are not sure you are in menopause.  You are having menopausal symptoms and need advice and treatment.  You are still having menstrual periods after age 55.  You have pain with intercourse.  Menopause is complete (no menstrual period for 12 months) and you develop vaginal bleeding.  You need a referral to a specialist (gynecologist, psychiatrist or psychologist) for treatment. SEEK IMMEDIATE MEDICAL CARE IF:   You have severe depression.  You have excessive vaginal bleeding.  You fell and   think you have a broken bone.  You have pain when you urinate.  You develop leg or chest pain.  You have a fast  pounding heart beat (palpitations).  You have severe headaches.  You develop vision problems.  You feel a lump in your breast.  You have abdominal pain or severe indigestion. Document Released: 02/03/2004 Document Revised: 02/05/2012 Document Reviewed: 09/10/2008 [facility] Patient Information 2014 [place], [place].   Gabapentin may make you sleepy.  You should start with 1/2 pill at bedtime x 5-7 days, then 1 pill at bedtime x 5-7 days, then 1 pill twice daily x 5-7 days then 1 pill three times daily. Chronic Pain Chronic pain can be defined as pain that is lasting, off and on, and lasts for 3 to 6 months or longer. Many things cause chronic pain, which can make it difficult to make a discrete diagnosis. There are many treatment options available for chronic pain. However, finding a treatment that works well for you may require trying various approaches until a suitable one is found. CAUSES  In some types of chronic medical conditions, the pain is caused by a normal pain response within the body. A normal pain response helps the body identify illness or injury and prevent further damage from being done. In these cases, the cause of the pain may be identified and treated, even if it may not be cured completely. Examples of chronic conditions which can cause chronic pain include:  Inflammation of the joints (arthritis).  Back pain or neck pain (including bulging or herniated disks).  Migraine headaches.  Cancer. In some other types of chronic pain syndromes, the pain is caused by an abnormal pain response within the body. An abnormal pain response is present when there is no ongoing cause (or stimulus) for the pain, or when the cause of the pain is arising from the nerves or nervous system itself. Examples of conditions which can cause chronic pain due to an abnormal pain response include:  Fibromyalgia.  Reflex sympathetic dystrophy (RSD).  Neuropathy (when the nerves themselves are  damaged, and may cause pain). DIAGNOSIS  Your caregiver will help diagnose your condition over time. In many cases, the initial focus will be on excluding conditions that could be causing the pain. Depending on your symptoms, your caregiver may order some tests to diagnose your condition. Some of these tests include:  Blood tests.  Computerized X-ray scans (CT scan).  Computerized magnetic scans (MRI).  X-rays.  Ultrasounds.  Nerve conduction studies.  Consultation with other physicians or specialists. TREATMENT  There are many treatment options for people suffering from chronic pain. Finding a treatment that works well may take time.   You may be referred to a pain management specialist.  You may be put on medication to help with the pain. Unfortunately, some medications (such as opiate medications) may not be very effective in cases where chronic pain is due to abnormal pain responses. Finding the right medications can take some time.  Adjunctive therapies may be used to provide additional relief and improve a patient's quality of life. These therapies include:  Mindfulness meditation.  Acupuncture.  Biofeedback.  Cognitive-behavioral therapy.  In certain cases, surgical interventions may be attempted. HOME CARE INSTRUCTIONS   Make sure you understand these instructions prior to discharge.  Ask any questions and share any further concerns you have with your caregiver prior to discharge.  Take all medications as directed by your caregiver.  Keep all follow-up appointments. SEEK MEDICAL CARE IF:  Your pain gets worse.  You develop a new pain that was not present before.  You cannot tolerate any medications prescribed by your caregiver.  You develop new symptoms since your last visit with your caregiver. SEEK IMMEDIATE MEDICAL CARE IF:   You develop muscular weakness.  You have decreased sensation or numbness.  You lose control of bowel or bladder  function.  Your pain suddenly gets much worse.  You have an oral temperature above 102 F (38.9 C), not controlled by medication.  You develop shaking chills, confusion, chest pain, or shortness of breath. Document Released: 08/05/2002 Document Revised: 02/05/2012 Document Reviewed: 11/11/2008 [facility] Patient Information 2014 [place], [place]. Pelvic Pain Pelvic pain is pain below the belly button and located between your hips. Acute pain may last a few hours or days. Chronic pelvic pain may last weeks and months. The cause may be different for different types of pain. The pain may be dull or sharp, mild or severe and can interfere with your daily activities. Write down and tell your caregiver:   Exactly where the pain is located.  If it comes and goes or is there all the time.  When it happens (with sex, urination, bowel movement, etc.)  If the pain is related to your menstrual period or stress. Your caregiver will take a full history and do a complete physical exam and Pap test. CAUSES   Painful menstrual periods (dysmenorrhea).  Normal ovulation (Mittelschmertz) that occurs in the middle of the menstrual cycle every month.  The pelvic organs get engorged with blood just before the menstrual period (pelvic congestive syndrome).  Scar tissue from an infection or past surgery (pelvic adhesions).  Cancer of the female pelvic organs. When there is pain with cancer, it has been there for a long time.  The lining of the uterus (endometrium) abnormally grows in places like the pelvis and on the pelvic organs (endometriosis).  A form of endometriosis with the lining of the uterus present inside of the muscle tissue of the uterus (adenomyosis).  Fibroid tumor (noncancerous) in the uterus.  Bladder problems such as infection, bladder spasms of the muscle tissue of the bladder.  Intestinal problems (irritable bowel syndrome, colitis, an ulcer or gastrointestinal infection).  Polyps  of the cervix or uterus.  Pregnancy in the tube (ectopic pregnancy).  The opening of the cervix is too small for the menstrual blood to flow through it (cervical stenosis).  Physical or sexual abuse (past or present).  Musculo-skeletal problems from poor posture, problems with the vertebrae of the lower back or the uterine pelvic muscles falling (prolapse).  Psychological problems such as depression or stress.  IUD (intrauterine device) in the uterus. DIAGNOSIS  Tests to make a diagnosis depends on the type, location, severity and what causes the pain to occur. Tests that may be needed include:  Blood tests.  Urine tests  Ultrasound.  X-rays.  CT Scan.  MRI.  Laparoscopy.  Major surgery. TREATMENT  Treatment will depend on the cause of the pain, which includes:  Prescription or over-the-counter pain medication.  Antibiotics.  Birth control pills.  Hormone treatment.  Nerve blocking injections.  Physical therapy.  Antidepressants.  Counseling with a psychiatrist or psychologist.  Minor or major surgery. HOME CARE INSTRUCTIONS   Only take over-the-counter or prescription medicines for pain, discomfort or fever as directed by your caregiver.  Follow your caregiver's advice to treat your pain.  Rest.  Avoid sexual intercourse if it causes the pain.  Apply warm or  cold compresses (which ever works best) to the pain area.  Do relaxation exercises such as yoga or meditation.  Try acupuncture.  Avoid stressful situations.  Try group therapy.  If the pain is because of a stomach/intestinal upset, drink clear liquids, eat a bland light food diet until the symptoms go away. SEEK MEDICAL CARE IF:   You need stronger prescription pain medication.  You develop pain with sexual intercourse.  You have pain with urination.  You develop a temperature of 102 F (38.9 C) with the pain.  You are still in pain after 4 hours of taking prescription  medication for the pain.  You need depression medication.  Your IUD is causing pain and you want it removed. SEEK IMMEDIATE MEDICAL CARE IF:  You develop very severe pain or tenderness.  You faint, have chills, severe weakness or dehydration.  You develop heavy vaginal bleeding or passing solid tissue.  You develop a temperature of 102 F (38.9 C) with the pain.  You have blood in the urine.  You are being physically or sexually abused.  You have uncontrolled vomiting and diarrhea.  You are depressed and afraid of harming yourself or someone else. Document Released: 12/21/2004 Document Revised: 02/05/2012 Document Reviewed: 09/17/2008 [facility] Patient Information 2013 [place], [place].

## 2013-07-29 NOTE — Assessment & Plan Note (Signed)
Begin HRT in 4-6 wks

## 2013-08-04 ENCOUNTER — Ambulatory Visit (INDEPENDENT_AMBULATORY_CARE_PROVIDER_SITE_OTHER): Payer: Medicaid Other | Admitting: Family Medicine

## 2013-08-04 ENCOUNTER — Encounter: Payer: Self-pay | Admitting: Family Medicine

## 2013-08-04 VITALS — BP 118/68 | HR 63 | Ht 63.0 in | Wt 123.2 lb

## 2013-08-04 DIAGNOSIS — N949 Unspecified condition associated with female genital organs and menstrual cycle: Secondary | ICD-10-CM

## 2013-08-04 DIAGNOSIS — N809 Endometriosis, unspecified: Secondary | ICD-10-CM

## 2013-08-04 DIAGNOSIS — R102 Pelvic and perineal pain: Secondary | ICD-10-CM

## 2013-08-04 MED ORDER — OXYCODONE-ACETAMINOPHEN 7.5-325 MG PO TABS: 1.0000 | ORAL_TABLET | Freq: Four times a day (QID) | ORAL | Status: DC | PRN

## 2013-08-04 NOTE — Patient Instructions (Signed)
Pelvic Pain Pelvic pain is pain below the belly button and located between your hips. Acute pain may last a few hours or days. Chronic pelvic pain may last weeks and months. The cause may be different for different types of pain. The pain may be dull or sharp, mild or severe and can interfere with your daily activities. Write down and tell your caregiver:   Exactly where the pain is located.  If it comes and goes or is there all the time.  When it happens (with sex, urination, bowel movement, etc.)  If the pain is related to your menstrual period or stress. Your caregiver will take a full history and do a complete physical exam and Pap test. CAUSES   Painful menstrual periods (dysmenorrhea).  Normal ovulation (Mittelschmertz) that occurs in the middle of the menstrual cycle every month.  The pelvic organs get engorged with blood just before the menstrual period (pelvic congestive syndrome).  Scar tissue from an infection or past surgery (pelvic adhesions).  Cancer of the female pelvic organs. When there is pain with cancer, it has been there for a long time.  The lining of the uterus (endometrium) abnormally grows in places like the pelvis and on the pelvic organs (endometriosis).  A form of endometriosis with the lining of the uterus present inside of the muscle tissue of the uterus (adenomyosis).  Fibroid tumor (noncancerous) in the uterus.  Bladder problems such as infection, bladder spasms of the muscle tissue of the bladder.  Intestinal problems (irritable bowel syndrome, colitis, an ulcer or gastrointestinal infection).  Polyps of the cervix or uterus.  Pregnancy in the tube (ectopic pregnancy).  The opening of the cervix is too small for the menstrual blood to flow through it (cervical stenosis).  Physical or sexual abuse (past or present).  Musculo-skeletal problems from poor posture, problems with the vertebrae of the lower back or the uterine pelvic muscles falling  (prolapse).  Psychological problems such as depression or stress.  IUD (intrauterine device) in the uterus. DIAGNOSIS  Tests to make a diagnosis depends on the type, location, severity and what causes the pain to occur. Tests that may be needed include:  Blood tests.  Urine tests  Ultrasound.  X-rays.  CT Scan.  MRI.  Laparoscopy.  Major surgery. TREATMENT  Treatment will depend on the cause of the pain, which includes:  Prescription or over-the-counter pain medication.  Antibiotics.  Birth control pills.  Hormone treatment.  Nerve blocking injections.  Physical therapy.  Antidepressants.  Counseling with a psychiatrist or psychologist.  Minor or major surgery. HOME CARE INSTRUCTIONS   Only take over-the-counter or prescription medicines for pain, discomfort or fever as directed by your caregiver.  Follow your caregiver's advice to treat your pain.  Rest.  Avoid sexual intercourse if it causes the pain.  Apply warm or cold compresses (which ever works best) to the pain area.  Do relaxation exercises such as yoga or meditation.  Try acupuncture.  Avoid stressful situations.  Try group therapy.  If the pain is because of a stomach/intestinal upset, drink clear liquids, eat a bland light food diet until the symptoms go away. SEEK MEDICAL CARE IF:   You need stronger prescription pain medication.  You develop pain with sexual intercourse.  You have pain with urination.  You develop a temperature of 102 F (38.9 C) with the pain.  You are still in pain after 4 hours of taking prescription medication for the pain.  You need depression medication.    Your IUD is causing pain and you want it removed. SEEK IMMEDIATE MEDICAL CARE IF:  You develop very severe pain or tenderness.  You faint, have chills, severe weakness or dehydration.  You develop heavy vaginal bleeding or passing solid tissue.  You develop a temperature of 102 F (38.9 C)  with the pain.  You have blood in the urine.  You are being physically or sexually abused.  You have uncontrolled vomiting and diarrhea.  You are depressed and afraid of harming yourself or someone else. Document Released: 12/21/2004 Document Revised: 02/05/2012 Document Reviewed: 09/17/2008 [employer] Patient Information 2013 [employer].  

## 2013-08-05 NOTE — Progress Notes (Signed)
  Subjective:    Patient ID: Sandy Cruz, female    DOB: 1981/05/20, 32 y.o.   MRN: [id number]  HPI  Now 2 wks pp from laparoscopic BSO for chronic pelvic pain and ? endometriosis.  Still reporting excruciating pain.  We started Gabapentin for pain but she is not up to full strength yet.  Should not still be having postop pain.  Discussed this at length.  Needs referral to pain management.  Wants rx to get her through until then.  This is her 3rd appointment since surgery.  She continues to request narcotic pain rx.  Review of Systems  Constitutional: Negative for fever and chills.  Respiratory: Negative for shortness of breath.   Cardiovascular: Negative for chest pain.  Gastrointestinal: Positive for abdominal pain.  Genitourinary: Positive for pelvic pain.       Objective:   Physical Exam  Constitutional: She appears well-developed and well-nourished.  Appears uncomfortable.  Abdominal: Soft. She exhibits no distension and no mass. There is tenderness (lower abdomen). There is no rebound and no guarding.          Assessment & Plan:

## 2013-08-05 NOTE — Assessment & Plan Note (Signed)
No evidence of this on pathology--I do not have prior pathology to confirm, just pt's story.  Advised that most endometriosis pain should be alleviated once in surgical menopause.  Continue to wait prior to starting HRT.  Some pain pathways may be difficult to change with just surgery, so continued titration of gabapentin is still recommended.  Continue NSAIDS.

## 2013-08-05 NOTE — Assessment & Plan Note (Signed)
I have given her one more refill of Oxycodone, with the intention of pain management referral to continue writing this rx for her.  She should hopefully be able to wean herself off, once gabapentin is stable.  Her endometriosis should no longer be the source of her pain.  She should not be feeling residual post-operative pain at this point.

## 2013-08-12 NOTE — Addendum Note (Signed)
Addended by: Barbara Cower on: 08/12/2013 11:28 AM   Modules accepted: Orders

## 2013-08-29 ENCOUNTER — Ambulatory Visit (INDEPENDENT_AMBULATORY_CARE_PROVIDER_SITE_OTHER): Payer: Medicaid Other | Admitting: Family Medicine

## 2013-08-29 ENCOUNTER — Encounter: Payer: Self-pay | Admitting: Family Medicine

## 2013-08-29 VITALS — BP 98/60 | HR 63 | Ht 63.0 in | Wt 122.0 lb

## 2013-08-29 DIAGNOSIS — N809 Endometriosis, unspecified: Secondary | ICD-10-CM

## 2013-08-29 DIAGNOSIS — N949 Unspecified condition associated with female genital organs and menstrual cycle: Secondary | ICD-10-CM

## 2013-08-29 DIAGNOSIS — R102 Pelvic and perineal pain: Secondary | ICD-10-CM

## 2013-08-29 DIAGNOSIS — E8941 Symptomatic postprocedural ovarian failure: Secondary | ICD-10-CM

## 2013-08-29 DIAGNOSIS — E8940 Asymptomatic postprocedural ovarian failure: Secondary | ICD-10-CM

## 2013-08-29 MED ORDER — ESTRADIOL 0.075 MG/24HR TD PTTW: 1.0000 | MEDICATED_PATCH | TRANSDERMAL | Status: AC

## 2013-08-29 NOTE — Progress Notes (Signed)
Here today for post op visit.  Still rating her pain at a level 5.

## 2013-08-29 NOTE — Progress Notes (Signed)
  Subjective:    Patient ID: Sandy Cruz, female    DOB: 29-Jul-1981, 32 y.o.   MRN: [id number]  HPI  6 wks s/p laparoscopic BSO for pain.  Continues to complain of pain.  We are no longer supplying narcotic pain meds for pt.  She has been referred to pain clinic. Her PCP is managing refills.  She is now menopausal, has risk factors for Osteoporosis including race, frame, smoking, menopause.  She has hot flashes and night sweats.  Review of Systems  Gastrointestinal: Positive for abdominal pain. Negative for nausea and diarrhea.  Genitourinary: Positive for pelvic pain. Negative for dysuria.       Objective:   Physical Exam  Vitals reviewed. Constitutional: She appears well-developed and well-nourished.  Cardiovascular: Normal rate.   Pulmonary/Chest: Effort normal.  Abdominal: Soft. There is no tenderness.  Incisions are all well healed.          Assessment & Plan:

## 2013-08-29 NOTE — Assessment & Plan Note (Signed)
If present and contributing to pain.  Will give 3 month hormone free interval.

## 2013-08-29 NOTE — Patient Instructions (Signed)
Pelvic Pain Pelvic pain is pain below the belly button and located between your hips. Acute pain may last a few hours or days. Chronic pelvic pain may last weeks and months. The cause may be different for different types of pain. The pain may be dull or sharp, mild or severe and can interfere with your daily activities. Write down and tell your caregiver:   Exactly where the pain is located.  If it comes and goes or is there all the time.  When it happens (with sex, urination, bowel movement, etc.)  If the pain is related to your menstrual period or stress. Your caregiver will take a full history and do a complete physical exam and Pap test. CAUSES   Painful menstrual periods (dysmenorrhea).  Normal ovulation (Mittelschmertz) that occurs in the middle of the menstrual cycle every month.  The pelvic organs get engorged with blood just before the menstrual period (pelvic congestive syndrome).  Scar tissue from an infection or past surgery (pelvic adhesions).  Cancer of the female pelvic organs. When there is pain with cancer, it has been there for a long time.  The lining of the uterus (endometrium) abnormally grows in places like the pelvis and on the pelvic organs (endometriosis).  A form of endometriosis with the lining of the uterus present inside of the muscle tissue of the uterus (adenomyosis).  Fibroid tumor (noncancerous) in the uterus.  Bladder problems such as infection, bladder spasms of the muscle tissue of the bladder.  Intestinal problems (irritable bowel syndrome, colitis, an ulcer or gastrointestinal infection).  Polyps of the cervix or uterus.  Pregnancy in the tube (ectopic pregnancy).  The opening of the cervix is too small for the menstrual blood to flow through it (cervical stenosis).  Physical or sexual abuse (past or present).  Musculo-skeletal problems from poor posture, problems with the vertebrae of the lower back or the uterine pelvic muscles falling  (prolapse).  Psychological problems such as depression or stress.  IUD (intrauterine device) in the uterus. DIAGNOSIS  Tests to make a diagnosis depends on the type, location, severity and what causes the pain to occur. Tests that may be needed include:  Blood tests.  Urine tests  Ultrasound.  X-rays.  CT Scan.  MRI.  Laparoscopy.  Major surgery. TREATMENT  Treatment will depend on the cause of the pain, which includes:  Prescription or over-the-counter pain medication.  Antibiotics.  Birth control pills.  Hormone treatment.  Nerve blocking injections.  Physical therapy.  Antidepressants.  Counseling with a psychiatrist or psychologist.  Minor or major surgery. HOME CARE INSTRUCTIONS   Only take over-the-counter or prescription medicines for pain, discomfort or fever as directed by your caregiver.  Follow your caregiver's advice to treat your pain.  Rest.  Avoid sexual intercourse if it causes the pain.  Apply warm or cold compresses (which ever works best) to the pain area.  Do relaxation exercises such as yoga or meditation.  Try acupuncture.  Avoid stressful situations.  Try group therapy.  If the pain is because of a stomach/intestinal upset, drink clear liquids, eat a bland light food diet until the symptoms go away. SEEK MEDICAL CARE IF:   You need stronger prescription pain medication.  You develop pain with sexual intercourse.  You have pain with urination.  You develop a temperature of 102 F (38.9 C) with the pain.  You are still in pain after 4 hours of taking prescription medication for the pain.  You need depression medication.    Your IUD is causing pain and you want it removed. SEEK IMMEDIATE MEDICAL CARE IF:  You develop very severe pain or tenderness.  You faint, have chills, severe weakness or dehydration.  You develop heavy vaginal bleeding or passing solid tissue.  You develop a temperature of 102 F (38.9 C)  with the pain.  You have blood in the urine.  You are being physically or sexually abused.  You have uncontrolled vomiting and diarrhea.  You are depressed and afraid of harming yourself or someone else. Document Released: 12/21/2004 Document Revised: 02/05/2012 Document Reviewed: 09/17/2008 [employer] Patient Information 2013 [employer].  

## 2013-08-29 NOTE — Assessment & Plan Note (Signed)
Referral to pain management

## 2013-08-29 NOTE — Assessment & Plan Note (Signed)
Begin Vivelle dot in 12/14 for heart and bone protection.

## 2013-09-15 ENCOUNTER — Telehealth: Payer: Self-pay

## 2013-09-15 NOTE — Telephone Encounter (Signed)
Patients PCP called our office on Friday to advice Sandy Cruz that he could not longer give her pain medicine or care for her pain. He wanted to let Sandy Cruz know. I informed him of everything that we had done and that Sandy Cruz could no longer do anything else for her pain and due to that referred her out to [facility] pain management. Patient had requested a letter from our office to give to her PCP [facility] stating she was returning to his office which we obliged. Sandy Cruz called Sandy Cruz with patient in office to let Sandy Cruz know he wasn't going to be able to give her pain medicine. I told him that we only gave her the letter because she wanted to return to his office where she originally came from. He was a bit upset. I clarified the situation on our end and explained she was already referred out from our office and that she was only awaiting to hear back from [facility] pain management clinic. He then said if we could, there was a new clinic in [place] that we could also referred her too. [facility]. I didn't understand why he was asking me to do this if the patient was already there at his office and could of done this referral himself. Never the less, I went on ahead and took the info and called patient and arranged the whole new referral. I also called [facility] pain management to let them know. I called Sandy Cruz and the patient and confirmed the new referral appointment. [facility] gave her an appointment on 09/19/13 @ 10:15am. Spoke to Sandy Cruz @ [phone]. Fax: [phone]. I clarified to patient that she would no longer come here for any pain issues or Sandy Cruz since he couldn't continue giving her pain medicine. She was advice to contact her new office for any future issues with her pain.

## 2014-02-25 ENCOUNTER — Other Ambulatory Visit: Payer: Self-pay | Admitting: Family Medicine

## 2014-09-28 ENCOUNTER — Encounter: Payer: Self-pay | Admitting: Family Medicine

## 2014-11-01 ENCOUNTER — Emergency Department: Payer: Self-pay | Admitting: Emergency Medicine

## 2014-11-01 LAB — COMPREHENSIVE METABOLIC PANEL
ALK PHOS: 67 U/L
AST: 31 U/L (ref 15–37)
Albumin: 3.6 g/dL (ref 3.4–5.0)
Anion Gap: 9 (ref 7–16)
BUN: 14 mg/dL (ref 7–18)
Bilirubin,Total: 0.4 mg/dL (ref 0.2–1.0)
CALCIUM: 9 mg/dL (ref 8.5–10.1)
CO2: 26 mmol/L (ref 21–32)
CREATININE: 0.82 mg/dL (ref 0.60–1.30)
Chloride: 107 mmol/L (ref 98–107)
EGFR (African American): >60
Glucose: 111 mg/dL — ABNORMAL HIGH (ref 65–99)
OSMOLALITY: 284 (ref 275–301)
POTASSIUM: 4.1 mmol/L (ref 3.5–5.1)
SGPT (ALT): 44 U/L
SODIUM: 142 mmol/L (ref 136–145)
TOTAL PROTEIN: 6.6 g/dL (ref 6.4–8.2)

## 2014-11-01 LAB — URINALYSIS, COMPLETE
Bacteria: NONE SEEN
Blood: NEGATIVE
Glucose,UR: NEGATIVE mg/dL (ref 0–75)
LEUKOCYTE ESTERASE: NEGATIVE
NITRITE: NEGATIVE
PH: 5 (ref 4.5–8.0)
RBC,UR: 9 /HPF (ref 0–5)
SPECIFIC GRAVITY: 1.035 (ref 1.003–1.030)

## 2014-11-01 LAB — CBC WITH DIFFERENTIAL/PLATELET
Basophil #: 0 10*3/uL (ref 0.0–0.1)
Basophil %: 0.5 %
EOS PCT: 2.1 %
Eosinophil #: 0.1 10*3/uL (ref 0.0–0.7)
HCT: 40.1 % (ref 35.0–47.0)
HGB: 13.2 g/dL (ref 12.0–16.0)
LYMPHS ABS: 1.4 10*3/uL (ref 1.0–3.6)
Lymphocyte %: 21.3 %
MCH: 30.8 pg (ref 26.0–34.0)
MCHC: 33 g/dL (ref 32.0–36.0)
MCV: 93 fL (ref 80–100)
MONOS PCT: 4.7 %
Monocyte #: 0.3 x10 3/mm (ref 0.2–0.9)
NEUTROS PCT: 71.4 %
Neutrophil #: 4.6 10*3/uL (ref 1.4–6.5)
Platelet: 245 10*3/uL (ref 150–440)
RBC: 4.29 10*6/uL (ref 3.80–5.20)
RDW: 13.1 % (ref 11.5–14.5)
WBC: 6.4 10*3/uL (ref 3.6–11.0)

## 2014-11-01 LAB — LIPASE, BLOOD: LIPASE: 72 U/L — AB (ref 73–393)

## 2015-03-02 ENCOUNTER — Ambulatory Visit: Payer: Medicaid Other | Admitting: Obstetrics & Gynecology

## 2015-03-16 NOTE — Op Note (Signed)
PATIENT NAME:  Sandy Cruz, Sandy Cruz MR#:  [id number] DATE OF BIRTH:  [birth year]  DATE OF PROCEDURE:  06/17/2012  PREOPERATIVE DIAGNOSES:  1. Chronic pelvic pain.  2. 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  3. Endometriosis.   POSTOPERATIVE DIAGNOSES:  1. Chronic pelvic pain.  2. 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  3. Endometriosis.   OPERATIVE PROCEDURE: Transvaginal hysterectomy.   SURGEON: Prentice DockerMartin A. Armella Stogner, MD  FIRST  ASSISTANT: None.    ANESTHESIA: General endotracheal.   INDICATIONS: The patient is a 34 year old white female, para 2-0-0-2, who presents for definitive surgical management of chronic pelvic pain and abnormal uterine bleeding that has been refractory to conservative medical therapies. She is thought to have endometriosis/adenomyosis. Patient also is noted to have pelvic organ prolapse with second degree uterine prolapse. She desires definitive surgery at this time. Preoperative laparoscopy revealed no gross evidence of endometriosis stigmata.   FINDINGS AT SURGERY: Avascular pelvis. There was second-degree uterine prolapse. The ovaries and tubes were grossly normal.   DESCRIPTION OF PROCEDURE: Patient was brought to the Operating Room where she was placed in the supine position. General endotracheal anesthesia was induced without difficulty. She was placed in the dorsal lithotomy position using the candy cane stirrups. A Betadine perineal, intravaginal prep and drape was performed in the standard fashion. A Foley catheter was placed draining clear yellow urine from the bladder. A weighted speculum was placed into the vagina and a double-tooth tenaculum was placed onto the cervix. Posterior colpotomy was made with Mayo scissors. The uterosacral ligaments were clamped, cut, and stick tied using 0 Vicryl suture. The cervix was circumscribed anteriorly and the bladder and vaginal mucosa were dissected off the lower uterine segment  through sharp and blunt dissection. Eventually the anterior cul-de-sac was entered. Cardinal broad ligament complexes were clamped, cut, and stick tied using 0 Vicryl suture. This was carried out to the level of the utero-ovarian ligaments which were then crossclamped and cut. The specimen was removed from the operative field. The utero-ovarian pedicles were then doubly ligated using 0 Vicryl. The first suture was a free tie with the following being a stick tie. Good hemostasis was obtained. The tubes and ovaries were noted to be normal. There is a small paratubal cyst on the left that was appreciated. Next, the peritoneum was closed using a 0 Vicryl pursestring  suture technique. The vaginal mucosa was then reapproximated using 2-0 chromic in a simple interrupted manner. Upon completion of the procedure all instrumentation was removed from the vagina. Patient was then awakened, mobilized, and taken to recovery room in satisfactory condition. Estimated blood loss was 100 mL. IV fluids infused were 1000 mL. Urine output was not quantified as of yet but was clear and had at least 200 mL in the bag. Ancef prophylaxis was given. All instruments, needle, sponge counts were verified as correct.    ____________________________ Prentice DockerMartin A. Hughes Wyndham, MD mad:cms D: 06/17/2012 13:57:01 ET T: 06/17/2012 14:14:24 ET JOB#: [id number]  cc: Daphine DeutscherMartin A. Cordie Buening, MD, <Dictator> Prentice DockerMARTIN A Alanta Scobey MD ELECTRONICALLY SIGNED 06/18/2012 13:05

## 2015-03-16 NOTE — H&P (Signed)
PATIENT NAME:  Sandy BarbaraSMELSER, Shawnta MR#:  [id number] DATE OF BIRTH:  1981/04/26  DATE OF ADMISSION:  06/17/2012  CHIEF COMPLAINT:  1. Chronic pelvic pain.  2. Pelvic organ prolapse.  3. Endometriosis.   HISTORY: Sandy BarbaraMichelle Markos is a [age] year old white female, para 2-0-0-2, who presents for definitive management of chronic pelvic pain and abnormal uterine bleeding that is thought to be secondary to endometriosis/adenomyosis. Patient has had extreme symptomatology requiring chronic narcotic use for symptom control. Birth control pills and Depo-Provera and Depo-Lupron have all been ineffective in totally controlling her pain. Patient does have some pelvic organ prolapse that may be contributing to her symptoms as well. She did undergo prior laparoscopy with biopsies which did not confirm endometriosis. Patient is now wishing to proceed with hysterectomy.   PAST MEDICAL HISTORY:  1. Depression.  2. History of cervical dysplasia.  3. Gastroesophageal reflux disease.  4. History of stomach ulcers.  5. Pelvic organ prolapse.  6. Deep thrust dyspareunia.  7. Chronic pelvic pain.   PAST SURGICAL HISTORY:  1. Urethral dilation.  2. Laparoscopy with biopsies.   PAST OB HISTORY: Para 2-0-0-2, spontaneous vaginal delivery x2.   FAMILY HISTORY: Colon cancer maternal grandfather, ovarian cancer maternal grandmother, endometriosis in mom and maternal aunt. No history of breast cancer.   SOCIAL HISTORY: Patient smokes 1 pack of cigarettes a day. She denies alcohol use. She denies drug use.   CURRENT MEDICATIONS:  1. Chantix.  2. Percocet.   REVIEW OF SYSTEMS: Patient denies recent illness. She denies history of coagulopathy. She denies reactive airway disease.   PHYSICAL EXAMINATION:  VITAL SIGNS: Height 5 feet 3 inches, weight 119.3 pounds, blood pressure 119/74, heart rate 60.   GENERAL: Patient is a pleasant well-appearing white female with normal affect. She is alert and oriented. Affect is  appropriate.   HEENT: Oropharynx clear.   NECK: Supple. No thyromegaly or adenopathy.   LUNGS: Clear.   HEART: Regular rate and rhythm without murmur.   ABDOMEN: Soft and nontender. No organomegaly.   PELVIC: External genitalia normal. BUS normal. Vagina has good estrogen effect. Cervix without gross lesions. It is mobile and slightly tender. Uterus is mobile and tender and is of normal size and shape. There appears to be a second degree prolapse of the uterus. Adnexa are clear and nontender.   RECTAL: Exam is not done.   EXTREMITIES: Without clubbing, cyanosis, or edema.   SKIN: Without rash.   MUSCULOSKELETAL: Normal.   IMPRESSION:  1. Chronic pelvic pain and abnormal uterine bleeding, refractory to conservative medical therapies.  2. Suspected endometriosis/adenomyosis.  3. Pelvic organ prolapse, mild.   PLAN: total vaginal hysterectomy; date of surgery is 06/17/2012.    CONSENT NOTE: Sandy Cruz is to undergo TVH on 06/17/2012. She is understanding of the planned procedure and is aware of and is accepting of all surgical risks which include but are not limited to bleeding, infection, pelvic organ injury with need for repair, blood clot disorders, anesthesia risks, nerve injury, and death. All questions are answered. Informed consent is given. Patient is ready and willing to proceed with surgery as scheduled.  ____________________________ Prentice DockerMartin A. DeFrancesco, MD mad:cms D: 06/14/2012 15:15:13 ET T: 06/14/2012 15:36:33 ET JOB#: [id number]  cc: Daphine DeutscherMartin A. DeFrancesco, MD, <Dictator> Prentice DockerMARTIN A DEFRANCESCO MD ELECTRONICALLY SIGNED 06/17/2012 14:03

## 2015-03-21 NOTE — H&P (Signed)
PATIENT NAME:  Sandy BarbaraSMELSER, Lasheena MR#:  [id number] DATE OF BIRTH:  Dec 14, 1980  DATE OF ADMISSION:  03/18/2012  ADMISSION DIAGNOSIS: Chronic pelvic pain (midline and left lower quadrant).   HISTORY OF PRESENT ILLNESS:  Sandy Cruz is a 34 year old divorced white female, para 2-0-0-2, in a monogamous relationship currently, using Microgestin 1/24 contraception, who presents for surgical evaluation of severe pelvic pain. The patient developed acute pelvic pain in the midline and left lower quadrant approximately three weeks ago. Pain has not completely resolved. Pain initially started in the left lower quadrant at the onset of menses. Four days after stopping menses, abnormal bleeding began again and recurrence of the pain recurred. She has had constant discomfort since then with intermittent exacerbations, most notably when she has onset of vaginal bleeding. Heating pad and Percocet have helped the pain but not completely alleviated it. She does not report long history of chronic back pain or deep thrust dyspareunia. No prior history of dysmenorrhea. There is family history of endometriosis in mother and maternal aunt.   PAST MEDICAL HISTORY:  1. Childhood depression.  2. Abnormal Pap smears.  3. Gastroesophageal reflux disease.  4. PUD.   PAST SURGICAL HISTORY: Urethral dilation.   PAST OB HISTORY: Para 2-0-0-2.   PAST GYN HISTORY: History of abnormal Pap smears in the past, none recently. Currently in a monogamous relationship, status post divorce.   FAMILY HISTORY: Heart disease, hypertension, bipolar disorder, history of endometriosis in mom and maternal aunt.   SOCIAL HISTORY: One pack a day smoker. Social alcohol use. No drug use.   REVIEW OF SYSTEMS: The patient denies recent upper respiratory infection. She denies history of asthma. No history of coagulopathy. The patient had work-up in the Emergency Room recently with testing negative for chlamydia, gonorrhea, pregnancy and urine.    CURRENT MEDICATIONS: Percocet 1 to 2 p.o. q.6 hours p.r.n.   DRUG ALLERGIES: Vicodin which causes nausea.   PHYSICAL EXAMINATION:  VITAL SIGNS: Height 5 feet, 3 inches, weight 117, BMI 21, blood pressure 106/74, heart rate 89.   GENERAL: The patient is a pleasant, tearful, anxious white female in no acute distress. She is alert and oriented.   OROPHARYNX: Clear.   NECK: Supple. There is no thyromegaly or adenopathy.   LUNGS: Clear.   HEART: Regular rate and rhythm without murmur.   ABDOMEN: Soft. No hepatosplenomegaly. There is umbilicus piercing present. Tenderness in the left lower quadrant is noted without guarding. There is also suprapubic tenderness noted.   PELVIC: External genitalia normal. BUS normal. Vagina has good estrogen effect. Cervix is without lesions. Cervical motion tenderness is 3/4. Uterine tenderness is 3/4. The uterus is of normal size and shape. It is mobile. Left lower quadrant is tender 3/4, without palpable mass. Right lower quadrant is nontender, nonpalpable.   RECTAL: Normal sphincter tone and no rectal masses. No significant uterosacral thickening or nodularity is appreciated.   SKIN: Without rash.   MUSCULOSKELETAL: Normal.   IMPRESSION:  1. History of persistent chronic pelvic pain, midline and left lower quadrant over the past month without adequate relief, requiring narcotic analgesics.  2. Family history of endometriosis.  3. Suspect endometriosis.   PLAN: Laparoscopy with peritoneal biopsies, possible left salpingo-oophorectomy if pathology is identified.   CONSENT NOTE: Sandy BarbaraMichelle Hazell is to undergo laparoscopy with peritoneal biopsies and treatment of endometriosis if present. She understands that left salpingo-oophorectomy may be performed if disease is noted in the left adnexal region. The patient is accepting of all risks of the surgery  which include but are not limited to bleeding, infection, pelvic organ injury with need for repair,  blood clot disorders, anesthesia risks, and death. All questions are answered. Informed consent is given. The patient is ready and willing to proceed with surgery as scheduled.   ____________________________ Prentice Docker Faatimah Spielberg, MD mad:ap D: 03/14/2012 08:49:58 ET T: 03/14/2012 09:31:14 ET JOB#: [id number]  cc: Daphine Deutscher A. Dellene Mcgroarty, MD, <Dictator> Prentice Docker Junius Faucett MD ELECTRONICALLY SIGNED 03/19/2012 12:40

## 2015-03-21 NOTE — Op Note (Signed)
PATIENT NAME:  Sandy Cruz, Akaya MR#:  [id number] DATE OF BIRTH:  [birth year]  DATE OF PROCEDURE:  03/18/2012  PREOPERATIVE DIAGNOSES:  1. Pelvic pain, midline and left lower quadrant.  2. Family history of endometriosis.   POSTOPERATIVE DIAGNOSES: 1. Pelvic pain, midline and left lower quadrant.  2. Family history of endometriosis.   OPERATIVE PROCEDURE: Laparoscopy with peritoneal biopsies.   SURGEON: Prentice DockerMartin A. DeFrancesco, MD    FIRST ASSISTANT: None.   ANESTHESIA: General endotracheal.   INDICATIONS: Sandy BarbaraMichelle Burkard is a 34 year old white female, para 2-0-0-2, using Microgestin 1/20 for contraception, who presents for surgical evaluation of worsening pelvic pain noted to be in the midline and left lower quadrant for the past two months. Abnormal uterine bleeding has developed in association with this pain causing increasing exacerbations of her symptoms. She has a strong family history of endometriosis and desires to have surgical evaluation of the problem.   FINDINGS AT SURGERY: A gynecoid bony pelvis; the uterus is mobile and of normal size and shape with the cervix descending to within 2 cm of the introitus. On laparoscopy the ovaries were normal bilaterally as were the fallopian tubes. There was a left paratubal cyst 0.5 cm noted. There were pseudo fenestrations in the cul-de-sac which were biopsied. There were hemorrhagic petechiae in the left ovarian fossa which were biopsied. The uterine cornu on the right was notable for a dilated area that appeared somewhat vascular on biopsy. This was cauterized for hemostasis. There was no other evidence of stigmata of endometriosis within the pelvis.   DESCRIPTION OF PROCEDURE: The patient was brought to the operating room where she was placed in the supine position. General endotracheal anesthesia was induced without difficulty. She was placed in low lithotomy position using the bumblebee stirrups. A ChloraPrep and Betadine abdominal, perineal  intravaginal prep and drape was performed in the standard fashion. A red Robinson catheter was used to drain 100 mL of urine from the bladder. A Hulka tenaculum was placed onto the cervix to facilitate uterine manipulation. A subumbilical incision 5 mm in length was made in the midline. The Optiview laparoscopic trocar system was used to place a 5 mm trocar directly into the abdominal pelvic cavity under direct visualization. No bowel or vascular injury was identified. A second 5 mm port was placed in the suprapubic region two fingerbreadths above the symphysis pubis. The above-noted findings were photo documented. The peritoneal abnormalities in the cul-de-sac and left ovarian fossa were biopsied with biopsy forceps. A right uterine cornua biopsy was performed and Kleppinger bipolar forceps were subsequently used to facilitate hemostasis in this region. The dilated bluish hue region of the right cornua was suspicious for possible endometriosis. The pelvis was copiously irrigated and the irrigant fluid was aspirated. Once satisfied with total inspection of the pelvis, the procedure was terminated with all instrumentation being removed from the abdomen. Pneumoperitoneum was released. The incisions were closed with Dermabond glue. The patient was then awakened, extubated, and taken to the recovery room in satisfactory condition. Estimated blood loss was minimal. Complications were none. All instruments, needle, and sponge counts were verified as correct.   ____________________________ Prentice DockerMartin A. DeFrancesco, MD mad:drc D: 03/19/2012 10:26:46 ET T: 03/19/2012 11:25:27 ET JOB#: [id number]  cc: Daphine DeutscherMartin A. DeFrancesco, MD, <Dictator> Prentice DockerMARTIN A DEFRANCESCO MD ELECTRONICALLY SIGNED 03/19/2012 12:42

## 2015-08-13 ENCOUNTER — Ambulatory Visit
Admission: RE | Admit: 2015-08-13 | Discharge: 2015-08-13 | Disposition: A | Discharge status: Home / Self Care | Payer: Disability Insurance | Source: Ambulatory Visit | Attending: Family Medicine | Admitting: Family Medicine

## 2015-08-13 ENCOUNTER — Other Ambulatory Visit: Payer: Self-pay | Admitting: Family Medicine

## 2015-08-13 DIAGNOSIS — M542 Cervicalgia: Secondary | ICD-10-CM | POA: Diagnosis present

## 2015-08-13 DIAGNOSIS — M549 Dorsalgia, unspecified: Secondary | ICD-10-CM

## 2015-08-13 DIAGNOSIS — M47814 Spondylosis without myelopathy or radiculopathy, thoracic region: Secondary | ICD-10-CM | POA: Diagnosis not present
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
## 2015-08-20 ENCOUNTER — Encounter: Payer: Self-pay | Admitting: Emergency Medicine

## 2015-08-20 ENCOUNTER — Emergency Department: Payer: Medicaid Other

## 2015-08-20 ENCOUNTER — Emergency Department
Admission: EM | Admit: 2015-08-20 | Discharge: 2015-08-21 | Disposition: A | Discharge status: Home / Self Care | Payer: Medicaid Other | Attending: Emergency Medicine | Admitting: Emergency Medicine

## 2015-08-20 DIAGNOSIS — I82402 Acute embolism and thrombosis of unspecified deep veins of left lower extremity: Secondary | ICD-10-CM

## 2015-08-20 DIAGNOSIS — I824Z2 Acute embolism and thrombosis of unspecified deep veins of left distal lower extremity: Secondary | ICD-10-CM | POA: Diagnosis not present

## 2015-08-20 DIAGNOSIS — Z791 Long term (current) use of non-steroidal anti-inflammatories (NSAID): Secondary | ICD-10-CM | POA: Insufficient documentation

## 2015-08-20 DIAGNOSIS — Z79899 Other long term (current) drug therapy: Secondary | ICD-10-CM | POA: Insufficient documentation

## 2015-08-20 DIAGNOSIS — N809 Endometriosis, unspecified: Secondary | ICD-10-CM

## 2015-08-20 DIAGNOSIS — Z720 Tobacco use: Secondary | ICD-10-CM | POA: Diagnosis not present

## 2015-08-20 DIAGNOSIS — M79605 Pain in left leg: Secondary | ICD-10-CM | POA: Diagnosis present

## 2015-08-20 HISTORY — DX: Dorsalgia, unspecified: M54.9

## 2015-08-20 LAB — BASIC METABOLIC PANEL
Anion gap: 5 (ref 5–15)
BUN: 16 mg/dL (ref 6–20)
CALCIUM: 8.5 mg/dL — AB (ref 8.9–10.3)
CO2: 28 mmol/L (ref 22–32)
CREATININE: 0.74 mg/dL (ref 0.44–1.00)
Chloride: 106 mmol/L (ref 101–111)
Glucose, Bld: 94 mg/dL (ref 65–99)
Potassium: 3.7 mmol/L (ref 3.5–5.1)
SODIUM: 139 mmol/L (ref 135–145)

## 2015-08-20 LAB — PROTIME-INR
INR: 0.97
PROTHROMBIN TIME: 13.1 s (ref 11.4–15.0)

## 2015-08-20 LAB — CBC WITH DIFFERENTIAL/PLATELET
BASOS PCT: 0 %
Basophils Absolute: 0 10*3/uL (ref 0–0.1)
EOS ABS: 0.2 10*3/uL (ref 0–0.7)
Eosinophils Relative: 3 %
HCT: 39.2 % (ref 35.0–47.0)
Hemoglobin: 13.1 g/dL (ref 12.0–16.0)
Lymphocytes Relative: 40 %
Lymphs Abs: 3 10*3/uL (ref 1.0–3.6)
MCH: 29.7 pg (ref 26.0–34.0)
MCHC: 33.5 g/dL (ref 32.0–36.0)
MCV: 88.8 fL (ref 80.0–100.0)
MONO ABS: 0.5 10*3/uL (ref 0.2–0.9)
MONOS PCT: 6 %
NEUTROS PCT: 51 %
Neutro Abs: 3.8 10*3/uL (ref 1.4–6.5)
PLATELETS: 221 10*3/uL (ref 150–440)
RBC: 4.41 MIL/uL (ref 3.80–5.20)
RDW: 13.3 % (ref 11.5–14.5)
WBC: 7.5 10*3/uL (ref 3.6–11.0)

## 2015-08-20 IMAGING — US US EXTREM LOW VENOUS*L*
1 series · 13 of 24 positions shown · non-contrast
Comparison: None.

CLINICAL DATA: Patient with left calf pain and swelling.



[Series 1: us extrem low venous*left* · 0.05mm/px · 13 of 40 slices shown]
[im 1/40]
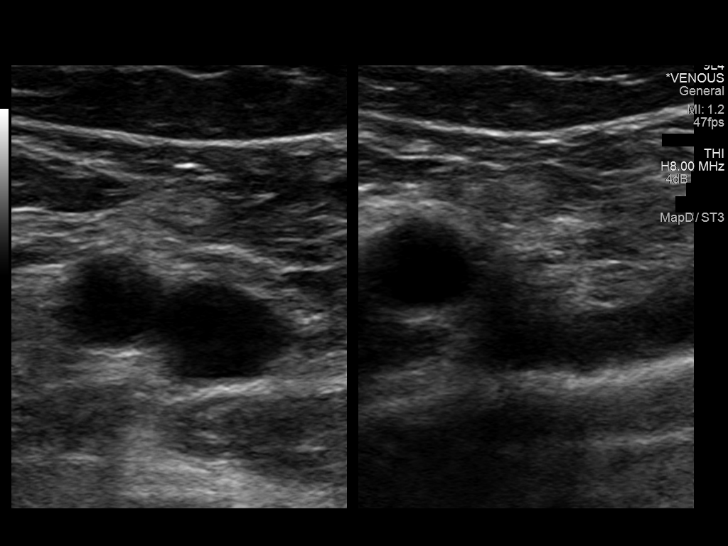
[im 4/40]
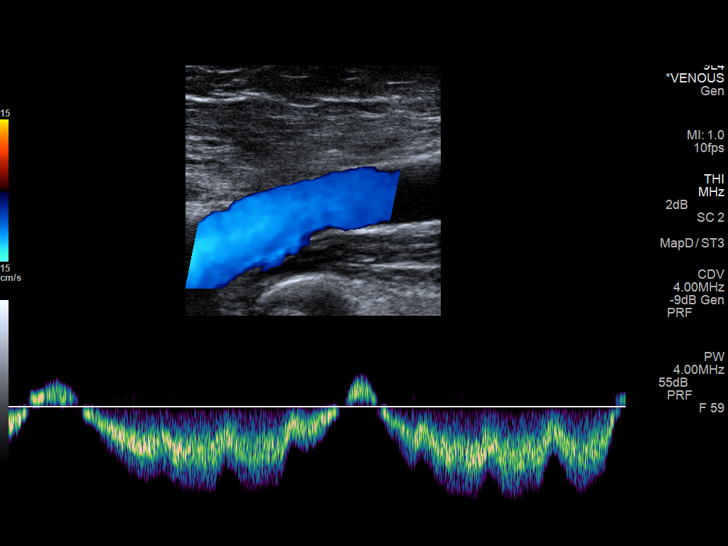
[im 7/40]
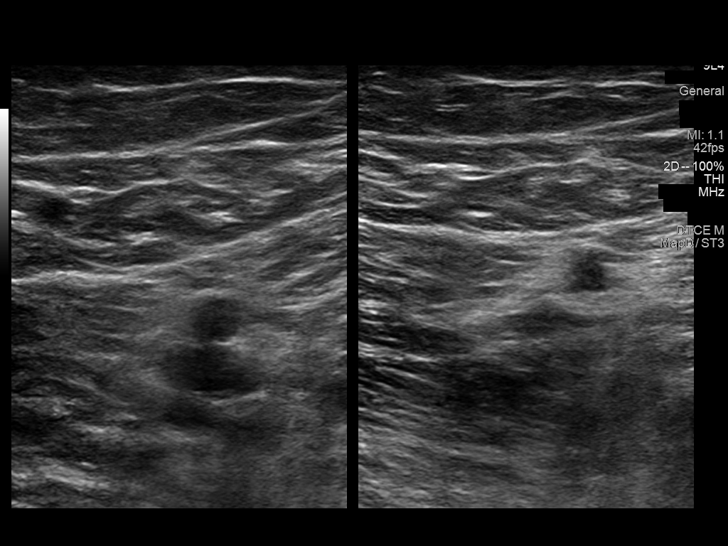
[im 11/40]
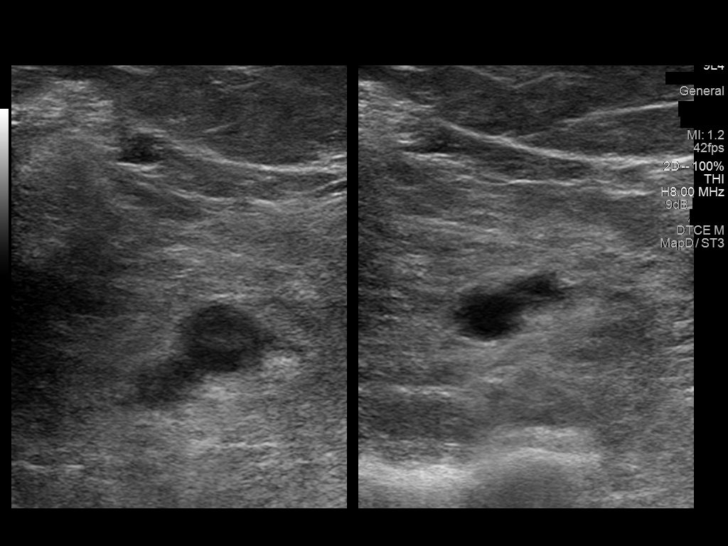
[im 14/40]
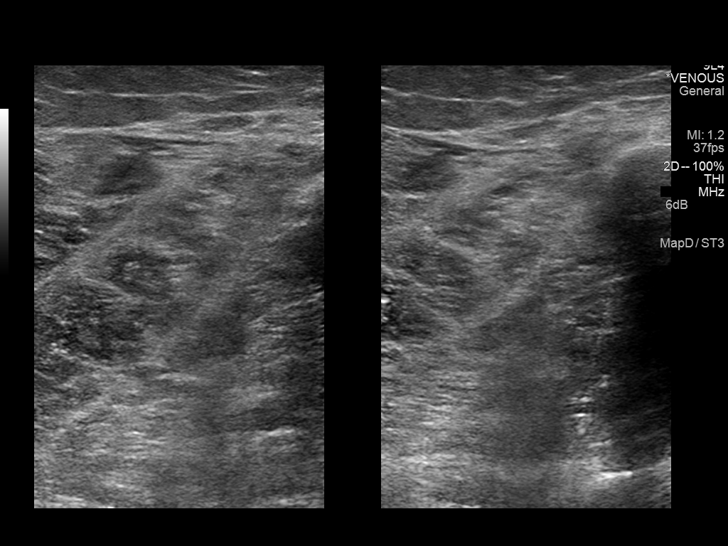
[im 17/40]
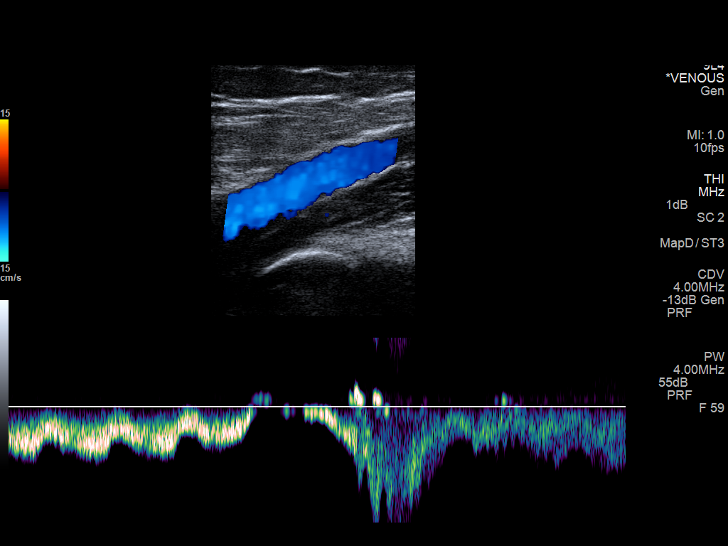
[im 21/40]
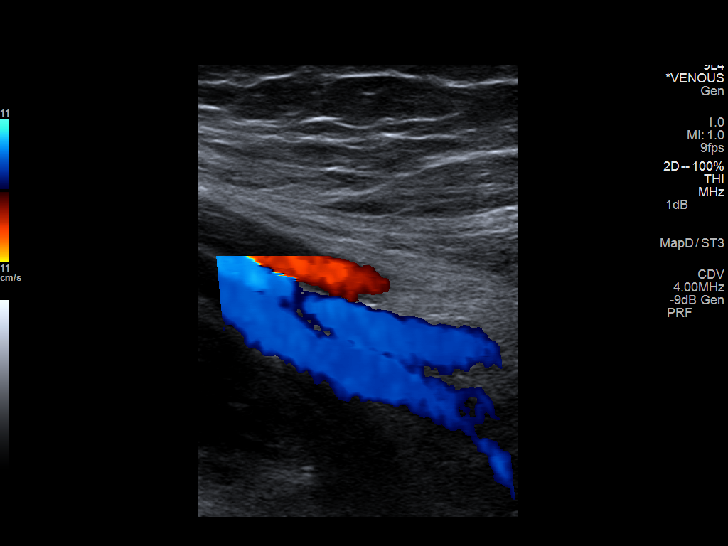
[im 23/40]
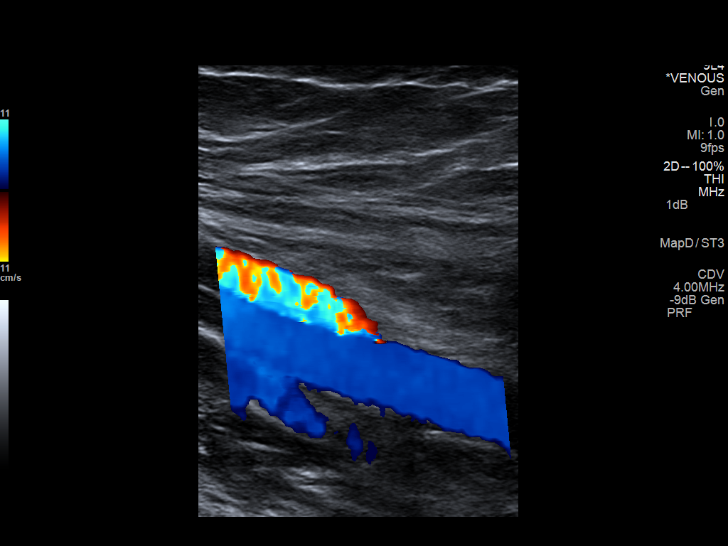
[im 26/40]
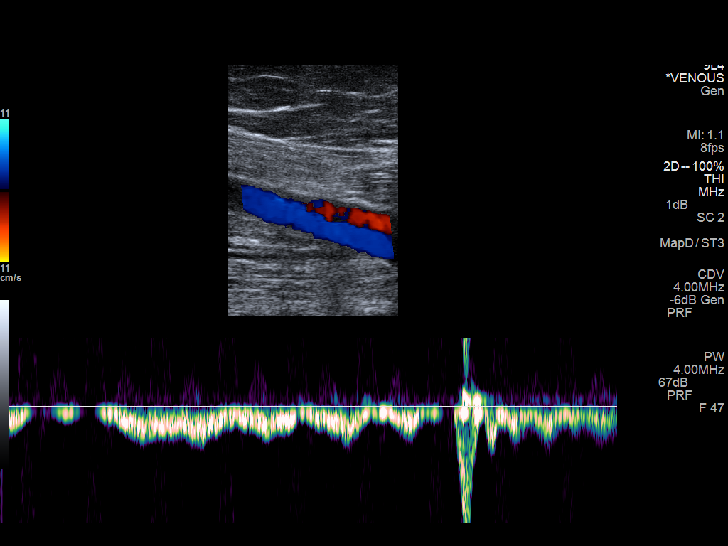
[im 29/40]
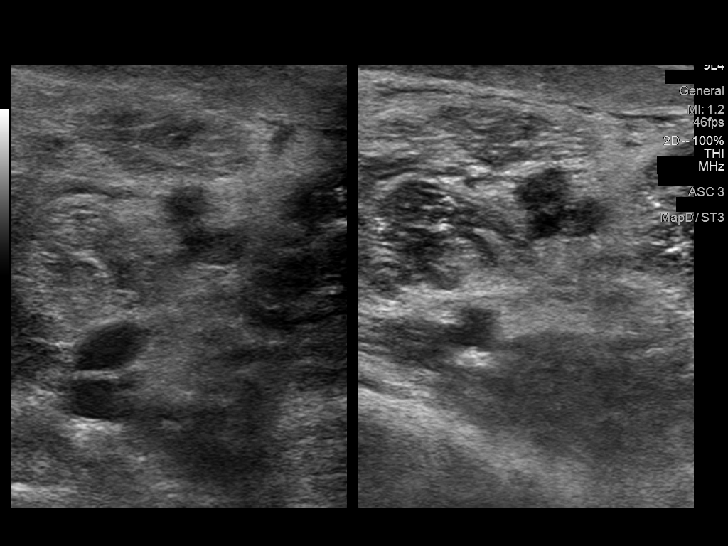
[im 33/40]
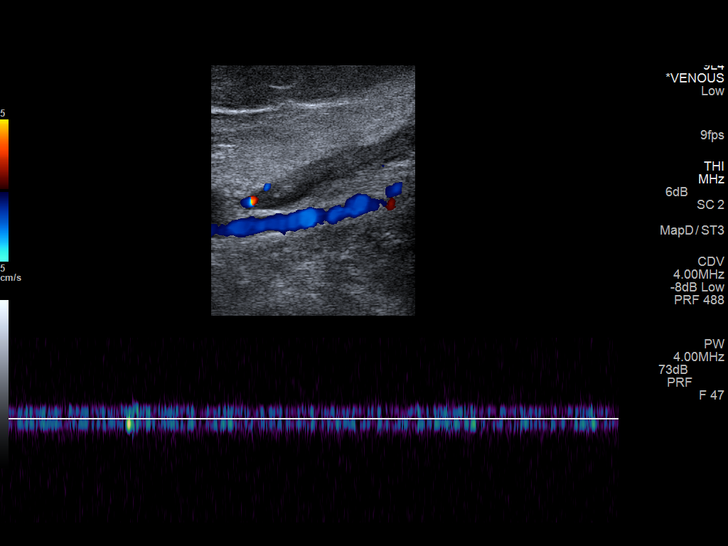
[im 36/40]
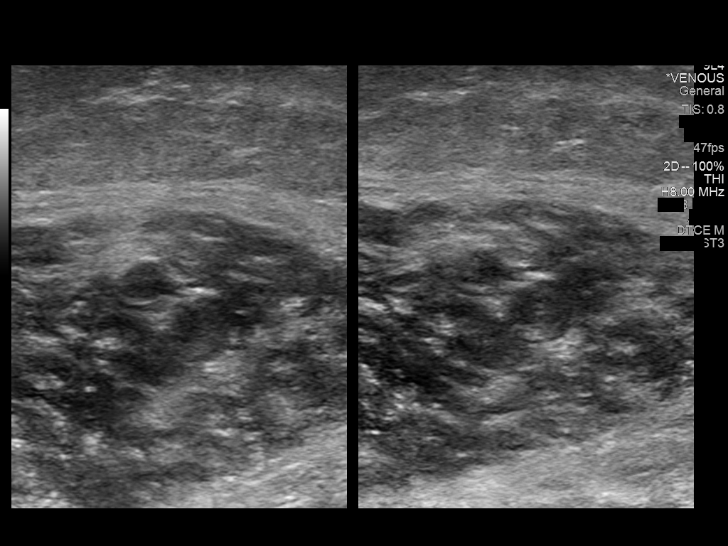
[im 40/40]
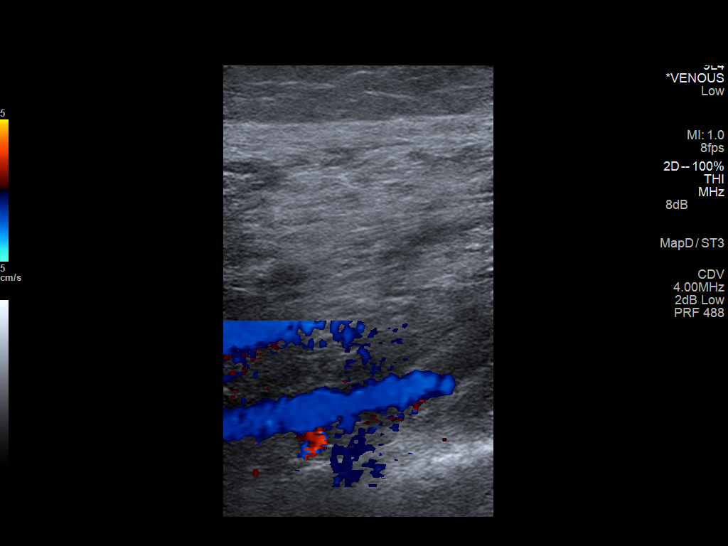

[13 of 24 positions shown; findings below may reference images not displayed]

FINDINGS: Contralateral Common Femoral Vein: Respiratory phasicity is normal
and symmetric with the symptomatic side. No evidence of thrombus.
Normal compressibility.

Common Femoral Vein: No evidence of thrombus. Normal
compressibility, respiratory phasicity and response to augmentation.

Saphenofemoral Junction: No evidence of thrombus. Normal
compressibility and flow on color Doppler imaging.

Profunda Femoral Vein: No evidence of thrombus. Normal
compressibility and flow on color Doppler imaging.

Femoral Vein: No evidence of thrombus. Normal compressibility,
respiratory phasicity and response to augmentation.

Popliteal Vein: Nonocclusive thrombus is demonstrated within
popliteal vein extending into the calf veins.

Calf Veins: Nonocclusive thrombus within the calf veins.

Venous Reflux:  None.

Other Findings:  None.
IMPRESSION: Nonocclusive thrombus is demonstrated within the popliteal vein
extending into the calf veins.

Critical Value/emergent results were called by telephone at the time
of interpretation on 08/20/2015 at [DATE] to Dr. MOMOTAJ LEMUA , who
verbally acknowledged these results.

## 2015-08-20 MED ORDER — RIVAROXABAN (XARELTO) VTE STARTER PACK (15 & 20 MG): ORAL_TABLET | ORAL | Status: AC

## 2015-08-20 MED ORDER — OXYCODONE-ACETAMINOPHEN 5-325 MG PO TABS
2.0000 | ORAL_TABLET | Freq: Once | ORAL | Status: AC
  Administered 2015-08-20: 2 via ORAL
  Filled 2015-08-20: qty 2

## 2015-08-20 MED ORDER — OXYCODONE-ACETAMINOPHEN 5-325 MG PO TABS: 1.0000 | ORAL_TABLET | ORAL | Status: AC | PRN

## 2015-08-20 NOTE — ED Provider Notes (Signed)
CSN: [id number]     Arrival date & time 08/20/15  1923 History   First MD Initiated Contact with Patient 08/20/15 2116     Chief Complaint  Patient presents with  . Leg Swelling  . Leg Pain     (Consider location/radiation/quality/duration/timing/severity/associated sxs/prior Treatment) The history is provided by the patient.  Sandy Cruz is a 34 y.o. female history endometriosis, who presented with left calf pain and swelling. Symptoms for the last 3 days. She just woke up 3 days ago with some calf pain and progressive swelling. Pain worsens when she moves her foot. Denies any chest pain or shortness of breath. Denies any recent travel or recent surgeries. Patient is not currently on discharge and or any contraceptions. Patient denies being pregnant. Denies any leg injury. No family hx of blood clots or hypercoagulable states.    Past Medical History  Diagnosis Date  . Ovarian cyst   . Endometriosis   . Anxiety   . Headache(784.0)     pinched nerves in neck  . Back pain    Past Surgical History  Procedure Laterality Date  . Vaginal hysterectomy  06/17/2012  . Urethra surgery    . Tonsillectomy and adenoidectomy    . Pelvic laparoscopy    . Vaginal delivery  1997. 2011  . Laparoscopy N/A 07/21/2013    Procedure: LAPAROSCOPY OPERATIVE;  Surgeon: Reva Bores, MD;  Location: [facility];  Service: Gynecology;  Laterality: N/A;  . Salpingoophorectomy Bilateral 07/21/2013    Procedure: SALPINGO OOPHORECTOMY  Bilateral;  Surgeon: Reva Bores, MD;  Location: [facility];  Service: Gynecology;  Laterality: Bilateral;   Family History  Problem Relation Age of Onset  . Ovarian cysts Mother   . Cervical cancer Mother   . Stomach cancer Maternal Grandmother   . Lung cancer Maternal Grandfather   . Heart disease Brother   . Lung cancer Maternal Uncle    Social History  Substance Use Topics  . Smoking status: Current Every Day Smoker -- 1.00 packs/day for 12 years    Types:  Cigarettes  . Smokeless tobacco: Never Used  . Alcohol Use: No   OB History    Gravida Para Term Preterm AB TAB SAB Ectopic Multiple Living   Review of Systems  Musculoskeletal:       L calf pain   All other systems reviewed and are negative.     Allergies  Hydrocodone  Home Medications   Prior to Admission medications   Medication Sig Start Date End Date Taking? Authorizing Provider  clonazePAM (KLONOPIN) 0.5 MG tablet Take 0.5 mg by mouth 2 (two) times daily as needed for anxiety.    Historical Provider, MD  cyclobenzaprine (FLEXERIL) 10 MG tablet Take 10 mg by mouth 3 (three) times daily as needed for muscle spasms.    Historical Provider, MD  estradiol (VIVELLE-DOT) 0.075 MG/24HR Place 1 patch onto the skin 2 (two) times a week. 08/29/13   Reva Bores, MD  etodolac (LODINE) 400 MG tablet Take 400 mg by mouth 2 (two) times daily.    Historical Provider, MD  gabapentin (NEURONTIN) 300 MG capsule TAKE ONE CAPSULE BY MOUTH THREE TIMES DAILY 02/25/14   Reva Bores, MD  oxyCODONE-acetaminophen (PERCOCET) 7.5-325 MG per tablet Take 1 tablet by mouth every 6 (six) hours as needed for pain. 08/04/13   Reva Bores, MD   BP 101/67 mmHg  Pulse 84  Temp(Src) 98.2 F (36.8 C) (Oral)  Resp 19  Ht  (1.6 m)  Wt 150 lb (68.04 kg)  BMI 26.58 kg/m2  SpO2 100% Physical Exam  Constitutional: She is oriented to person, place, and time.  Uncomfortable   HENT:  Head: Normocephalic.  Mouth/Throat: Oropharynx is clear and moist.  Eyes: Conjunctivae are normal. Pupils are equal, round, and reactive to light.  Neck: Normal range of motion. Neck supple.  Cardiovascular: Normal rate, regular rhythm and normal heart sounds.   Pulmonary/Chest: Effort normal and breath sounds normal. No respiratory distress. She has no wheezes. She has no rales.  Abdominal: Soft. Bowel sounds are normal. She exhibits no distension. There is no tenderness. There is no rebound.   Musculoskeletal:  L calf swollen and tender. 2+ pulses. No obvious cellulitis. Nl sensation   Neurological: She is alert and oriented to person, place, and time.  Skin: Skin is warm and dry.  Psychiatric: She has a normal mood and affect. Her behavior is normal. Judgment and thought content normal.  Nursing note and vitals reviewed.   ED Course  Procedures (including critical care time) Labs Review Labs Reviewed  BASIC METABOLIC PANEL - Abnormal; Notable for the following:    Calcium 8.5 (*)    All other components within normal limits  CBC WITH DIFFERENTIAL/PLATELET  PROTIME-INR    Imaging Review US Venous Img Lower Unilateral Left  08/20/2015   CLINICAL DATA:  Patient with left calf pain and swelling.  EXAM: LEFT LOWER EXTREMITY VENOUS DOPPLER ULTRASOUND  TECHNIQUE: Gray-scale sonography with graded compression, as well as color Doppler and duplex ultrasound were performed to evaluate the lower extremity deep venous systems from the level of the common femoral vein and including the common femoral, femoral, profunda femoral, popliteal and calf veins including the posterior tibial, peroneal and gastrocnemius veins when visible. The superficial great saphenous vein was also interrogated. Spectral Doppler was utilized to evaluate flow at rest and with distal augmentation maneuvers in the common femoral, femoral and popliteal veins.  COMPARISON:  None.  FINDINGS: Contralateral Common Femoral Vein: Respiratory phasicity is normal and symmetric with the symptomatic side. No evidence of thrombus. Normal compressibility.  Common Femoral Vein: No evidence of thrombus. Normal compressibility, respiratory phasicity and response to augmentation.  Saphenofemoral Junction: No evidence of thrombus. Normal compressibility and flow on color Doppler imaging.  Profunda Femoral Vein: No evidence of thrombus. Normal compressibility and flow on color Doppler imaging.  Femoral Vein: No evidence of thrombus. Normal  compressibility, respiratory phasicity and response to augmentation.  Popliteal Vein: Nonocclusive thrombus is demonstrated within popliteal vein extending into the calf veins.  Calf Veins: Nonocclusive thrombus within the calf veins.  Venous Reflux:  None.  Other Findings:  None.  IMPRESSION: Nonocclusive thrombus is demonstrated within the popliteal vein extending into the calf veins.  Critical Value/emergent results were called by telephone at the time of interpretation on 08/20/2015 at 9:32 pm to Dr. Chaney Malling , who verbally acknowledged these results.   Electronically Signed   By: Annia Belt M.D.   On: 08/20/2015 21:38   I have personally reviewed and evaluated these images and lab results as part of my medical decision-making.   EKG Interpretation None      MDM   Final diagnoses:  None    JOURI THREAT is a 34 y.o. female here with L calf pain. Consider DVT. No signs of PE, not tachy or hypoxic, stable vitals. No obvious provoking  factor but also no family history. Will get DVT study.   10:24 PM She has DVT from popliteal vein into calf veins. Hemodynamically stable. Cr nl. Given coupon for xarelto starter pack. She has PCP follow up and [employer]. Told her to see PCP and discuss further testing for hypercoagulable state and also discuss anticoagulation options.       Richardean Canal, MD 08/20/15 [phone]

## 2015-08-20 NOTE — ED Notes (Signed)
Pt states awoke 2 days pta with left calf pain and swelling. Left calf is swollen, painful to touch. Pt states pain increases with flexing of left foot. Pt denies shob, denies pain anywhere else.

## 2015-08-20 NOTE — ED Notes (Signed)
Main lab notified to add PTT-INR, spoke with [facility], states will add.

## 2015-08-20 NOTE — Discharge Instructions (Signed)
Take xarelto as prescribed. Use coupon.   Take tylenol for pain.  Take percocet for severe pain.   See your doctor to discuss options for blood thinners and further testing if needed.  Return to ER if you have chest pain, shortness of breath, palpitations, worse leg swelling.

## 2016-05-03 ENCOUNTER — Emergency Department
Admission: EM | Admit: 2016-05-03 | Discharge: 2016-05-03 | Disposition: A | Discharge status: Home / Self Care | Payer: Medicaid Other | Attending: Emergency Medicine | Admitting: Emergency Medicine

## 2016-05-03 ENCOUNTER — Encounter: Payer: Self-pay | Admitting: *Deleted

## 2016-05-03 DIAGNOSIS — M545 Low back pain: Secondary | ICD-10-CM | POA: Diagnosis present

## 2016-05-03 DIAGNOSIS — M5416 Radiculopathy, lumbar region: Secondary | ICD-10-CM | POA: Diagnosis not present

## 2016-05-03 DIAGNOSIS — F17210 Nicotine dependence, cigarettes, uncomplicated: Secondary | ICD-10-CM | POA: Diagnosis not present

## 2016-05-03 MED ORDER — METHYLPREDNISOLONE 4 MG PO TBPK: ORAL_TABLET | ORAL | Status: AC

## 2016-05-03 MED ORDER — METHOCARBAMOL 750 MG PO TABS: 1500.0000 mg | ORAL_TABLET | Freq: Four times a day (QID) | ORAL | Status: AC

## 2016-05-03 MED ORDER — METHOCARBAMOL 500 MG PO TABS
1000.0000 mg | ORAL_TABLET | Freq: Once | ORAL | Status: AC
  Administered 2016-05-03: 1000 mg via ORAL
  Filled 2016-05-03: qty 2

## 2016-05-03 MED ORDER — HYDROMORPHONE HCL 1 MG/ML IJ SOLN
1.0000 mg | Freq: Once | INTRAMUSCULAR | Status: AC
  Administered 2016-05-03: 1 mg via INTRAMUSCULAR
  Filled 2016-05-03: qty 1

## 2016-05-03 MED ORDER — DEXAMETHASONE SODIUM PHOSPHATE 10 MG/ML IJ SOLN
10.0000 mg | Freq: Once | INTRAMUSCULAR | Status: AC
  Administered 2016-05-03: 10 mg via INTRAMUSCULAR
  Filled 2016-05-03: qty 1

## 2016-05-03 NOTE — ED Provider Notes (Signed)
[facility] Emergency Department Provider Note    ____________________________________________  Time seen: Approximately 12:16 PM  I have reviewed the triage vital signs and the nursing notes.   HISTORY  Chief Complaint Back Pain    HPI Sandy Cruz is a 35 y.o. female patient complain of acute back pain. Patient state she had a twisting incident yesterday was excerabated  her chronic back pain. Patient stated f radicular component to her pain this time. Patient denies any bladder or bowel dysfunction. Patient is currently on OxyContin and oxycodone. Patient rates her pain as a 10 over 10. Except for her narcotic medication today no other palliative measures complaint. Past Medical History  Diagnosis Date  . Ovarian cyst   . Endometriosis   . Anxiety   . Headache(784.0)     pinched nerves in neck  . Back pain     Patient Active Problem List   Diagnosis Date Noted  . 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 Pelvic pain 05/01/2013  . Endometriosis 05/01/2013    Past Surgical History  Procedure Laterality Date  . Vaginal hysterectomy  06/17/2012  . Urethra surgery    . Tonsillectomy and adenoidectomy    . Pelvic laparoscopy    . Vaginal delivery  1997. 2011  . Laparoscopy N/A 07/21/2013    Procedure: LAPAROSCOPY OPERATIVE;  Surgeon: Reva Boresanya S Pratt, MD;  Location: [facility];  Service: Gynecology;  Laterality: N/A;  . Salpingoophorectomy Bilateral 07/21/2013    Procedure: SALPINGO OOPHORECTOMY  Bilateral;  Surgeon: Reva Boresanya S Pratt, MD;  Location: [facility];  Service: Gynecology;  Laterality: Bilateral;    Current Outpatient Rx  Name  Route  Sig  Dispense  Refill  . clonazePAM (KLONOPIN) 0.5 MG tablet   Oral   Take 0.5 mg by mouth 2 (two) times daily as needed for anxiety.         . cyclobenzaprine (FLEXERIL) 10 MG tablet   Oral   Take 10 mg by mouth 3 (three) times daily as needed for muscle spasms.         Marland Kitchen. estradiol (VIVELLE-DOT) 0.075  MG/24HR   Transdermal   Place 1 patch onto the skin 2 (two) times a week.   8 patch   12   . etodolac (LODINE) 400 MG tablet   Oral   Take 400 mg by mouth 2 (two) times daily.         Marland Kitchen. gabapentin (NEURONTIN) 300 MG capsule      TAKE ONE CAPSULE BY MOUTH THREE TIMES DAILY   90 capsule   0   . methocarbamol (ROBAXIN-750) 750 MG tablet   Oral   Take 2 tablets (1,500 mg total) by mouth 4 (four) times daily.   40 tablet   0   . methylPREDNISolone (MEDROL DOSEPAK) 4 MG TBPK tablet      Take Tapered dose as directed   21 tablet   0   . oxyCODONE-acetaminophen (PERCOCET) 5-325 MG per tablet   Oral   Take 1 tablet by mouth every 4 (four) hours as needed.   10 tablet   0   . Rivaroxaban (XARELTO STARTER PACK) 15 & 20 MG TBPK      Take as directed on package: Start with one 15mg  tablet by mouth twice a day with food. On Day 22, switch to one 20mg  tablet once a day with food.   51 each   0     Allergies Hydrocodone  Family History  Problem Relation Age  of Onset  . Ovarian cysts Mother   . Cervical cancer Mother   . Stomach cancer Maternal Grandmother   . Lung cancer Maternal Grandfather   . Heart disease Brother   . Lung cancer Maternal Uncle     Social History Social History  Substance Use Topics  . Smoking status: Current Every Day Smoker -- 1.00 packs/day for 12 years    Types: Cigarettes  . Smokeless tobacco: Never Used  . Alcohol Use: No    Review of Systems Constitutional: No fever/chills Eyes: No visual changes. ENT: No sore throat. Cardiovascular: Denies chest pain. Respiratory: Denies shortness of breath. Gastrointestinal: No abdominal pain.  No nausea, no vomiting.  No diarrhea.  No constipation. Genitourinary: Negative for dysuria. Musculoskeletal:Chronic back pain  Skin: Negative for rash. Neurological: Negative for headaches, focal weakness or numbness. Psychiatric:Anxiety Allergic/Immunilogical: Hydrocodone  ____________________________________________   PHYSICAL EXAM:  VITAL SIGNS: ED Triage Vitals  Enc Vitals Group     BP 05/03/16 1207 120/75 mmHg     Pulse Rate 05/03/16 1207 88     Resp 05/03/16 1207 18     Temp 05/03/16 1207 98.4 F (36.9 C)     Temp Source 05/03/16 1207 Oral     SpO2 05/03/16 1207 99 %     Weight 05/03/16 1200 160 lb (72.576 kg)     Height 05/03/16 1200 5\' 3"  (1.6 m)     Head Cir --      Peak Flow --      Pain Score 05/03/16 1201 10     Pain Loc --      Pain Edu? --      Excl. in GC? --     Constitutional: Alert and oriented. Well appearing and in no acute distress. Eyes: Conjunctivae are normal. PERRL. EOMI. Head: Atraumatic. Nose: No congestion/rhinnorhea. Mouth/Throat: Mucous membranes are moist.  Oropharynx non-erythematous. Neck: No stridor.  No cervical spine tenderness to palpation. Hematological/Lymphatic/Immunilogical: No cervical lymphadenopathy. Cardiovascular: Normal rate, regular rhythm. Grossly normal heart sounds.  Good peripheral circulation. Respiratory: Normal respiratory effort.  No retractions. Lungs CTAB. Gastrointestinal: Soft and nontender. No distention. No abdominal bruits. No CVA tenderness. Musculoskeletal: No obvious deformity to the lumbar spine. Patient is moderate guarding palpation L3-S1. Patient decreased range of motion's all fields. Patient has some right paraspinal muscle spasms. Patient Leg test.  Neurologic:  Normal speech and language. No gross focal neurologic deficits are appreciated. No gait instability. Skin:  Skin is warm, dry and intact. No rash noted. Psychiatric: Mood and affect are normal. Speech and behavior are normal.  ____________________________________________   LABS (all labs ordered are listed, but only abnormal results are displayed)  Labs Reviewed - No data to  display ____________________________________________  EKG   ____________________________________________  RADIOLOGY   ____________________________________________   PROCEDURES  Procedure(s) performed: None  Critical Care performed: No  ____________________________________________   INITIAL IMPRESSION / ASSESSMENT AND PLAN / ED COURSE  Pertinent labs & imaging results that were available during my care of the patient were reviewed by me and considered in my medical decision making (see chart for details).  Radicular low back pain. Advised patient to continue previous pain medications. Patient given prescription for Robaxin and Medrol Dosepak. Patient advised follow-up with her treating doctor as needed. ____________________________________________   FINAL CLINICAL IMPRESSION(S) / ED DIAGNOSES  Final diagnoses:  Acute radicular low back pain      NEW MEDICATIONS STARTED DURING THIS VISIT:  New Prescriptions   METHOCARBAMOL (ROBAXIN-750) 750 MG TABLET  Take 2 tablets (1,500 mg total) by mouth 4 (four) times daily.   METHYLPREDNISOLONE (MEDROL DOSEPAK) 4 MG TBPK TABLET    Take Tapered dose as directed     Note:  This document was prepared using Dragon voice recognition software and may include unintentional dictation errors.    Joni Reining, PA-C 05/03/16 1224  Richardean Canal, MD 05/03/16 1455

## 2016-05-03 NOTE — Discharge Instructions (Signed)
Radicular Pain °Radicular pain in either the arm or leg is usually from a bulging or herniated disk in the spine. A piece of the herniated disk may press against the nerves as the nerves exit the spine. This causes pain which is felt at the tips of the nerves down the arm or leg. Other causes of radicular pain may include: °· Fractures. °· Heart disease. °· Cancer. °· An abnormal and usually degenerative state of the nervous system or nerves (neuropathy). °Diagnosis may require CT or MRI scanning to determine the primary cause.  °Nerves that start at the neck (nerve roots) may cause radicular pain in the outer shoulder and arm. It can spread down to the thumb and fingers. The symptoms vary depending on which nerve root has been affected. In most cases radicular pain improves with conservative treatment. Neck problems may require physical therapy, a neck collar, or cervical traction. Treatment may take many weeks, and surgery may be considered if the symptoms do not improve.  °Conservative treatment is also recommended for sciatica. Sciatica causes pain to radiate from the lower back or buttock area down the leg into the foot. Often there is a history of back problems. Most patients with sciatica are better after 2 to 4 weeks of rest and other supportive care. Short term bed rest can reduce the disk pressure considerably. Sitting, however, is not a good position since this increases the pressure on the disk. You should avoid bending, lifting, and all other activities which make the problem worse. Traction can be used in severe cases. Surgery is usually reserved for patients who do not improve within the first months of treatment. °Only take over-the-counter or prescription medicines for pain, discomfort, or fever as directed by your caregiver. Narcotics and muscle relaxants may help by relieving more severe pain and spasm and by providing mild sedation. Cold or massage can give significant relief. Spinal manipulation  is not recommended. It can increase the degree of disc protrusion. Epidural steroid injections are often effective treatment for radicular pain. These injections deliver medicine to the spinal nerve in the space between the protective covering of the spinal cord and back bones (vertebrae). Your caregiver can give you more information about steroid injections. These injections are most effective when given within two weeks of the onset of pain.  °You should see your caregiver for follow up care as recommended. A program for neck and back injury rehabilitation with stretching and strengthening exercises is an important part of management.  °SEEK IMMEDIATE MEDICAL CARE IF: °· You develop increased pain, weakness, or numbness in your arm or leg. °· You develop difficulty with bladder or bowel control. °· You develop abdominal pain. °  °This information is not intended to replace advice given to you by your health care provider. Make sure you discuss any questions you have with your health care provider. °  °Document Released: 12/21/2004 Document Revised: 12/04/2014 Document Reviewed: 06/09/2015 °Elsevier Interactive Patient Education ©2016 [employer] ° °

## 2016-05-03 NOTE — ED Notes (Signed)
Patient c/o back pain. Chronic issue. States it is worse today after twisting it.

## 2016-05-03 NOTE — ED Notes (Signed)
Pt has a history of back pain, pt reports twisting yesterday with increase pain

## 2016-11-28 ENCOUNTER — Emergency Department
Admission: EM | Admit: 2016-11-28 | Discharge: 2016-11-29 | Disposition: A | Discharge status: Home / Self Care | Payer: Medicaid Other | Attending: Emergency Medicine | Admitting: Emergency Medicine

## 2016-11-28 ENCOUNTER — Encounter: Payer: Self-pay | Admitting: Emergency Medicine

## 2016-11-28 DIAGNOSIS — F17210 Nicotine dependence, cigarettes, uncomplicated: Secondary | ICD-10-CM | POA: Diagnosis not present

## 2016-11-28 DIAGNOSIS — R1031 Right lower quadrant pain: Secondary | ICD-10-CM | POA: Insufficient documentation

## 2016-11-28 DIAGNOSIS — R102 Pelvic and perineal pain: Secondary | ICD-10-CM | POA: Insufficient documentation

## 2016-11-28 LAB — URINALYSIS, COMPLETE (UACMP) WITH MICROSCOPIC
BACTERIA UA: NONE SEEN
GLUCOSE, UA: NEGATIVE mg/dL
HGB URINE DIPSTICK: NEGATIVE
Ketones, ur: 5 mg/dL — AB
NITRITE: NEGATIVE
PROTEIN: 30 mg/dL — AB
SPECIFIC GRAVITY, URINE: 1.036 — AB (ref 1.005–1.030)
pH: 5 (ref 5.0–8.0)

## 2016-11-28 LAB — BASIC METABOLIC PANEL
ANION GAP: 6 (ref 5–15)
BUN: 12 mg/dL (ref 6–20)
CHLORIDE: 108 mmol/L (ref 101–111)
CO2: 26 mmol/L (ref 22–32)
Calcium: 9.2 mg/dL (ref 8.9–10.3)
Creatinine, Ser: 0.74 mg/dL (ref 0.44–1.00)
GFR calc Af Amer: >60 mL/min (ref >60)
GLUCOSE: 105 mg/dL — AB (ref 65–99)
POTASSIUM: 3.6 mmol/L (ref 3.5–5.1)
Sodium: 140 mmol/L (ref 135–145)

## 2016-11-28 LAB — CBC
HCT: 42.5 % (ref 35.0–47.0)
HEMOGLOBIN: 14.7 g/dL (ref 12.0–16.0)
MCH: 30.6 pg (ref 26.0–34.0)
MCHC: 34.5 g/dL (ref 32.0–36.0)
MCV: 88.7 fL (ref 80.0–100.0)
PLATELETS: 271 10*3/uL (ref 150–440)
RBC: 4.79 MIL/uL (ref 3.80–5.20)
RDW: 13.8 % (ref 11.5–14.5)
WBC: 11.2 10*3/uL — AB (ref 3.6–11.0)

## 2016-11-28 MED ORDER — ONDANSETRON HCL 4 MG/2ML IJ SOLN
4.0000 mg | Freq: Once | INTRAMUSCULAR | Status: AC
Start: 2016-11-28 — End: 2016-11-28
  Administered 2016-11-28: 4 mg via INTRAVENOUS
  Filled 2016-11-28: qty 2

## 2016-11-28 MED ORDER — MORPHINE SULFATE (PF) 2 MG/ML IV SOLN
2.0000 mg | Freq: Once | INTRAVENOUS | Status: AC
  Administered 2016-11-28: 2 mg via INTRAVENOUS
  Filled 2016-11-28: qty 1

## 2016-11-28 MED ORDER — IOPAMIDOL (ISOVUE-300) INJECTION 61%
30.0000 mL | Freq: Once | INTRAVENOUS | Status: AC | PRN
  Administered 2016-11-28: 30 mL via ORAL

## 2016-11-28 NOTE — ED Triage Notes (Signed)
Pt in with co right sided abd pain, hx of endometriosis states feels the same.

## 2016-11-28 NOTE — ED Notes (Signed)
Pt c/o RLQ abd pain that radiates to groin.  Pt denies CP, SOB, n/v/d, dizziness or fever.  Pt A/Ox4.  Pt sts that she has chronic abd/pelvic pain, but this worse than normal.  Pt sts that she takes pain medication but it 'hasn't touched it'.

## 2016-11-28 NOTE — ED Provider Notes (Signed)
[facility] Emergency Department Provider Note    First MD Initiated Contact with Patient 11/28/16 2306     (approximate)  I have reviewed the triage vital signs and the nursing notes.   HISTORY  Chief Complaint Abdominal Pain    HPI Sandy Cruz is a 36 y.o. female with history of endometriosis status post total hysterectomy chronic pelvic pain presents to the emergency department with 10 out of 10 right lower quadrant/right pelvic painn and yesterday. Patient states that her pain is unrelieved with her pain management regimen at home of OxyContin twice a day and oxycodone 10 mg for breakthrough   Past Medical History:  Diagnosis Date  . Anxiety   . Back pain   . Endometriosis   . Headache(784.0)    pinched nerves in neck  . Ovarian cyst     Patient Active Problem List   Diagnosis Date Noted  . 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 Pelvic pain 05/01/2013  . Endometriosis 05/01/2013    Past Surgical History:  Procedure Laterality Date  . LAPAROSCOPY N/A 07/21/2013   Procedure: LAPAROSCOPY OPERATIVE;  Surgeon: Reva Bores, MD;  Location: [facility];  Service: Gynecology;  Laterality: N/A;  . PELVIC LAPAROSCOPY    . SALPINGOOPHORECTOMY Bilateral 07/21/2013   Procedure: SALPINGO OOPHORECTOMY  Bilateral;  Surgeon: Reva Bores, MD;  Location: [facility];  Service: Gynecology;  Laterality: Bilateral;  . TONSILLECTOMY AND ADENOIDECTOMY    . URETHRA SURGERY    . VAGINAL DELIVERY  1997. 2011  . VAGINAL HYSTERECTOMY  06/17/2012    Prior to Admission medications   Medication Sig Start Date End Date Taking? Authorizing Provider  clonazePAM (KLONOPIN) 0.5 MG tablet Take 0.5 mg by mouth 2 (two) times daily as needed for anxiety.    Historical Provider, MD  cyclobenzaprine (FLEXERIL) 10 MG tablet Take 10 mg by mouth 3 (three) times daily as needed for muscle spasms.    Historical Provider, MD  estradiol (VIVELLE-DOT) 0.075 MG/24HR Place 1 patch onto the  skin 2 (two) times a week. 08/29/13   Reva Bores, MD  etodolac (LODINE) 400 MG tablet Take 400 mg by mouth 2 (two) times daily.    Historical Provider, MD  gabapentin (NEURONTIN) 300 MG capsule TAKE ONE CAPSULE BY MOUTH THREE TIMES DAILY 02/25/14   Reva Bores, MD  methocarbamol (ROBAXIN-750) 750 MG tablet Take 2 tablets (1,500 mg total) by mouth 4 (four) times daily. 05/03/16   Joni Reining, PA-C  methylPREDNISolone (MEDROL DOSEPAK) 4 MG TBPK tablet Take Tapered dose as directed 05/03/16   Joni Reining, PA-C  oxyCODONE-acetaminophen (PERCOCET) 5-325 MG per tablet Take 1 tablet by mouth every 4 (four) hours as needed. 08/20/15   Charlynne Pander, MD  oxyCODONE-acetaminophen (ROXICET) 5-325 MG tablet Take 1 tablet by mouth every 4 (four) hours as needed for severe pain. 11/29/16   Darci Current, MD  Rivaroxaban (XARELTO STARTER PACK) 15 & 20 MG TBPK Take as directed on package: Start with one 15mg  tablet by mouth twice a day with food. On Day 22, switch to one 20mg  tablet once a day with food. 08/20/15   Charlynne Pander, MD    Allergies Hydrocodone  Family History  Problem Relation Age of Onset  . Ovarian cysts Mother   . Cervical cancer Mother   . Stomach cancer Maternal Grandmother   . Lung cancer Maternal Grandfather   . Heart disease Brother   . Lung cancer Maternal Uncle  Social History Social History  Substance Use Topics  . Smoking status: Current Every Day Smoker    Packs/day: 1.00    Years: 12.00    Types: Cigarettes  . Smokeless tobacco: Never Used  . Alcohol use No    Review of Systems Constitutional: No fever/chills Eyes: No visual changes. ENT: No sore throat. Cardiovascular: Denies chest pain. Respiratory: Denies shortness of breath. Gastrointestinal: Positive for right lower quadrant/pelvic pain..  No nausea, no vomiting.  No diarrhea.  No constipation. Genitourinary: Negative for dysuria. Musculoskeletal: Negative for back pain. Skin: Negative for  rash. Neurological: Negative for headaches, focal weakness or numbness.  10-point ROS otherwise negative.  ____________________________________________   PHYSICAL EXAM:  VITAL SIGNS: ED Triage Vitals  Enc Vitals Group     BP 11/28/16 2235 92/65     Pulse Rate 11/28/16 2235 67     Resp 11/28/16 2235 16     Temp --      Temp src --      SpO2 11/28/16 2235 99 %     Weight 11/28/16 2101 155 lb (70.3 kg)     Height 11/28/16 2101 5\' 3"  (1.6 m)     Head Circumference --      Peak Flow --      Pain Score 11/28/16 2101 10     Pain Loc --      Pain Edu? --      Excl. in GC? --     Constitutional: Alert and oriented.Apparent discomfort.  Eyes: Conjunctivae are normal. PERRL. EOMI. Head: Atraumatic. Mouth/Throat: Mucous membranes are moist.  Oropharynx non-erythematous. Neck: No stridor. Cardiovascular: Normal rate, regular rhythm. Good peripheral circulation. Grossly normal heart sounds. Respiratory: Normal respiratory effort.  No retractions. Lungs CTAB. Gastrointestinal: Right lower quadrant tenderness to palpation.. No distention.  Musculoskeletal: No lower extremity tenderness nor edema. No gross deformities of extremities. Neurologic:  Normal speech and language. No gross focal neurologic deficits are appreciated.  Skin:  Skin is warm, dry and intact. No rash noted. Psychiatric: Mood and affect are normal. Speech and behavior are normal.  ____________________________________________   LABS (all labs ordered are listed, but only abnormal results are displayed)  Labs Reviewed  CBC - Abnormal; Notable for the following:       Result Value   WBC 11.2 (*)    All other components within normal limits  BASIC METABOLIC PANEL - Abnormal; Notable for the following:    Glucose, Bld 105 (*)    All other components within normal limits  URINALYSIS, COMPLETE (UACMP) WITH MICROSCOPIC - Abnormal; Notable for the following:    Color, Urine AMBER (*)    APPearance HAZY (*)     Specific Gravity, Urine 1.036 (*)    Bilirubin Urine SMALL (*)    Ketones, ur 5 (*)    Protein, ur 30 (*)    Leukocytes, UA TRACE (*)    Squamous Epithelial / LPF 0-5 (*)    All other components within normal limits    RADIOLOGY I, [place] Aris Even, personally viewed and evaluated these images (plain radiographs) as part of my medical decision making, as well as reviewing the written report by the radiologist.  Ct Abdomen Pelvis W Contrast  Result Date: 11/29/2016 CLINICAL DATA:  Right lower quadrant abdominal pain radiating to the groin, chronic but much worse tonight. EXAM: CT ABDOMEN AND PELVIS WITH CONTRAST TECHNIQUE: Multidetector CT imaging of the abdomen and pelvis was performed using the standard protocol following bolus administration of intravenous contrast.  CONTRAST:  ISOVUE-300 IOPAMIDOL (ISOVUE-300) INJECTION 61% COMPARISON:  None. FINDINGS: Lower chest: No acute abnormality. Hepatobiliary: No focal liver abnormality is seen. No gallstones, gallbladder wall thickening, or biliary dilatation. Pancreas: Unremarkable. No pancreatic ductal dilatation or surrounding inflammatory changes. Spleen: Normal in size without focal abnormality. Adrenals/Urinary Tract: Adrenal glands are unremarkable. Kidneys are normal, without renal calculi, focal lesion, or hydronephrosis. Bladder is unremarkable. Stomach/Bowel: Stomach and small bowel are normal. Appendix is normal. There is a mildly tethered appearance of the mid sigmoid to the right of midline. The CT appearance is are most suggestive of scarring, perhaps related to the history of endometriosis and prior hysterectomy. Acute inflammation is less likely. No bowel obstruction. No perforation. No pneumatosis. Vascular/Lymphatic: No significant vascular findings are present. No enlarged abdominal or pelvic lymph nodes. Reproductive: Status post hysterectomy. No adnexal masses. Other: No ascites. Musculoskeletal: No significant skeletal lesion.  IMPRESSION: Normal appendix. Mildly tethered appearance of the mid sigmoid colon to the right of midline, with findings more suggestive of chronic scarring although a component of inflammation is not entirely excluded. No obstruction or perforation. No abscess. No ascites. Electronically Signed   By: Ellery Plunk M.D.   On: 11/29/2016 02:28     Procedures    INITIAL IMPRESSION / ASSESSMENT AND PLAN / ED COURSE  Pertinent labs & imaging results that were available during my care of the patient were reviewed by me and considered in my medical decision making (see chart for details).  36 year old female with history of endometriosis presents to the emergency department with right pelvic/right lower quadrant abdominal pain CT scan of the abdomen and pelvis revealed no acute gross abnormality. Patient given IV morphine in emergency department and subsequently Percocet with improvement of pain.   Clinical Course     ____________________________________________  FINAL CLINICAL IMPRESSION(S) / ED DIAGNOSES  Final diagnoses:  Pelvic pain     MEDICATIONS GIVEN DURING THIS VISIT:  Medications  morphine 2 MG/ML injection 2 mg (2 mg Intravenous Given 11/28/16 2351)  ondansetron (ZOFRAN) injection 4 mg (4 mg Intravenous Given 11/28/16 2351)  iopamidol (ISOVUE-300) 61 % injection 30 mL (30 mLs Oral Contrast Given 11/28/16 2331)  iopamidol (ISOVUE-300) 61 % injection 100 mL (100 mLs Intravenous Contrast Given 11/29/16 0129)  oxyCODONE-acetaminophen (PERCOCET/ROXICET) 5-325 MG per tablet 1 tablet (1 tablet Oral Given 11/29/16 0253)     NEW OUTPATIENT MEDICATIONS STARTED DURING THIS VISIT:  Discharge Medication List as of 11/29/2016  3:16 AM    START taking these medications   Details  !! oxyCODONE-acetaminophen (ROXICET) 5-325 MG tablet Take 1 tablet by mouth every 4 (four) hours as needed for severe pain., Starting Wed 11/29/2016, Print     !! - Potential duplicate medications found. Please  discuss with provider.      Discharge Medication List as of 11/29/2016  3:16 AM      Discharge Medication List as of 11/29/2016  3:16 AM       Note:  This document was prepared using Dragon voice recognition software and may include unintentional dictation errors.    Darci Current, MD 11/29/16 [phone]

## 2016-11-29 ENCOUNTER — Encounter: Payer: Self-pay | Admitting: Radiology

## 2016-11-29 ENCOUNTER — Emergency Department: Payer: Medicaid Other

## 2016-11-29 IMAGING — CT CT ABD-PELV W/ CM
2 of 4 series · 16 of 46 positions shown, 18 images · IV contrast (APPLIED)
Comparison: None.

CLINICAL DATA: Right lower quadrant abdominal pain radiating to the
groin, chronic but much worse tonight.

EXAM:
CT ABDOMEN AND PELVIS WITH CONTRAST
TECHNIQUE: Multidetector CT imaging of the abdomen and pelvis was performed
using the standard protocol following bolus administration of
intravenous contrast.
CONTRAST:  100mL IMEDEM-BHH IOPAMIDOL (IMEDEM-BHH) INJECTION 61%

[Series 2: routine abd/pel with · axial · 0.85mm/px · z∈[-1072,-637]mm · 13 of 95 slices shown, 15 images]
[im 4/95  soft-tissue]
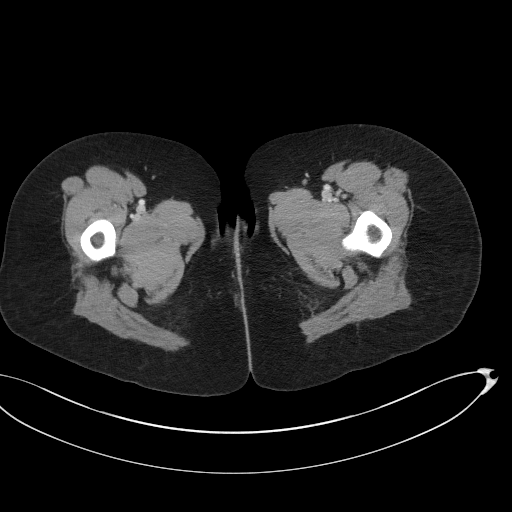
[im 4/95  bone]
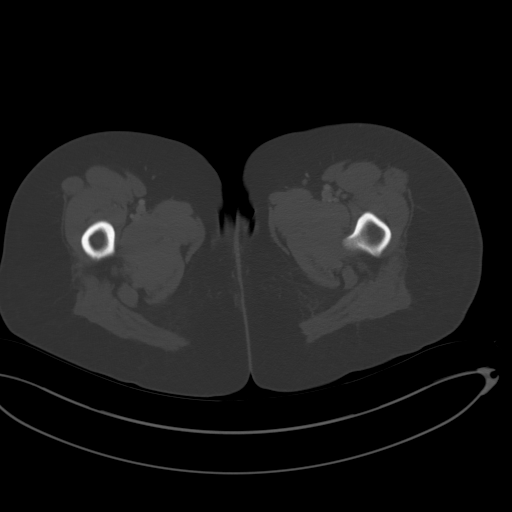
[im 12/95  soft-tissue]
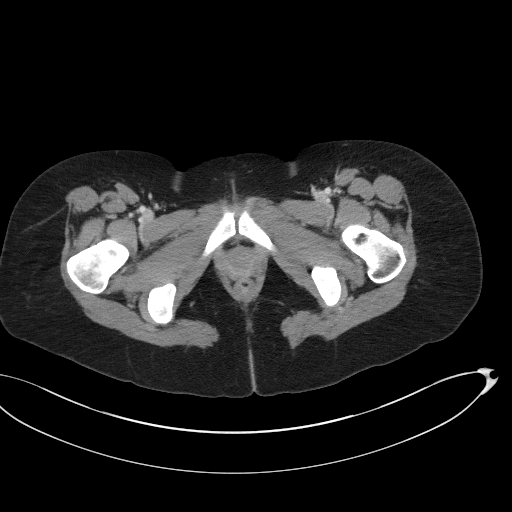
[im 20/95  soft-tissue]
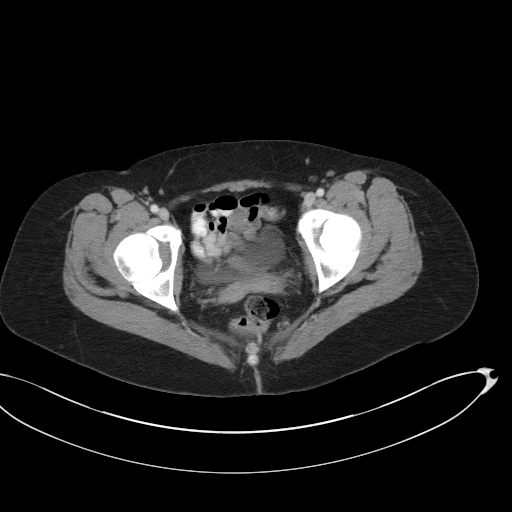
[im 28/95  soft-tissue]
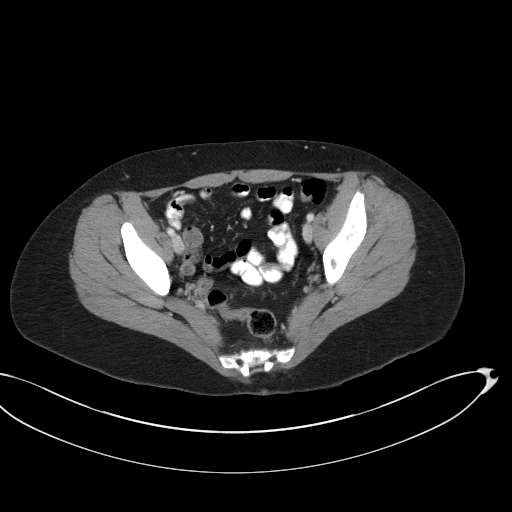
[im 32/95  soft-tissue]
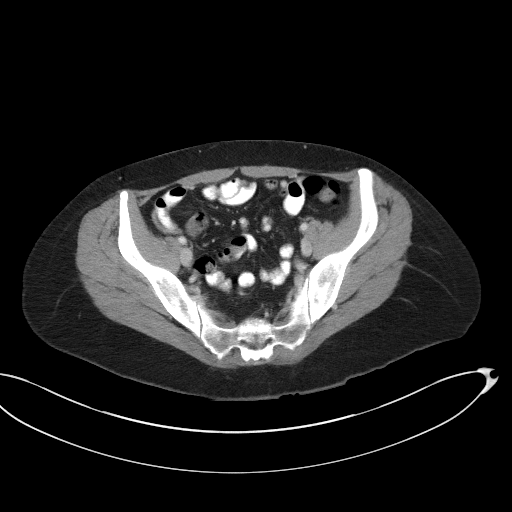
[im 40/95  soft-tissue]
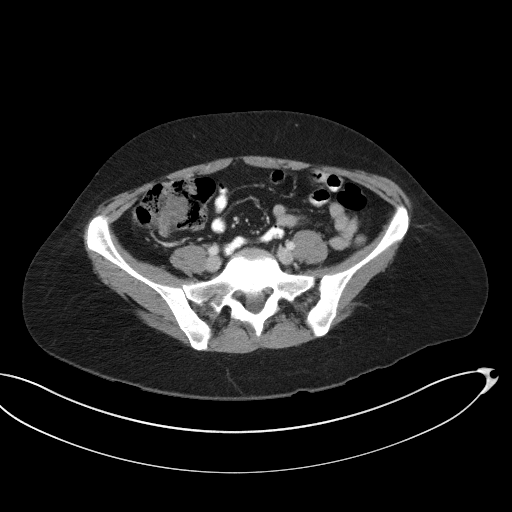
[im 48/95  soft-tissue]
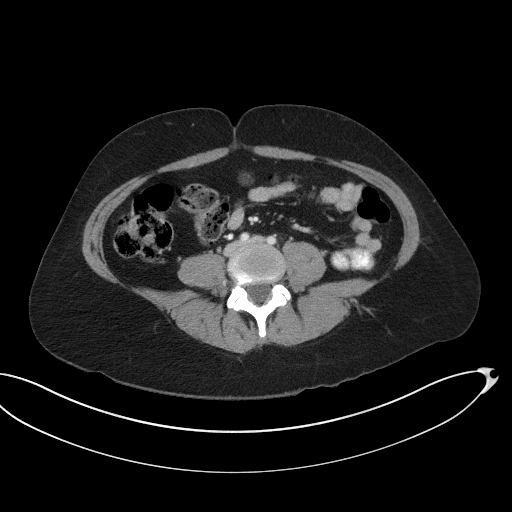
[im 55/95  soft-tissue]
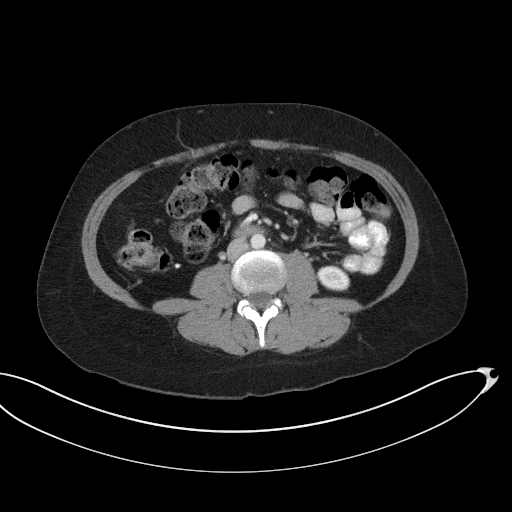
[im 63/95  soft-tissue]
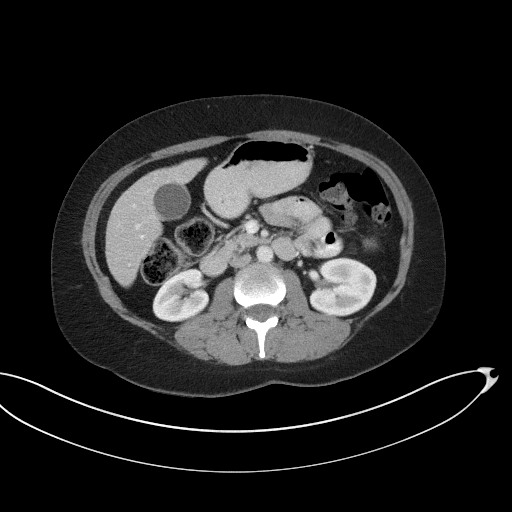
[im 63/95  bone]
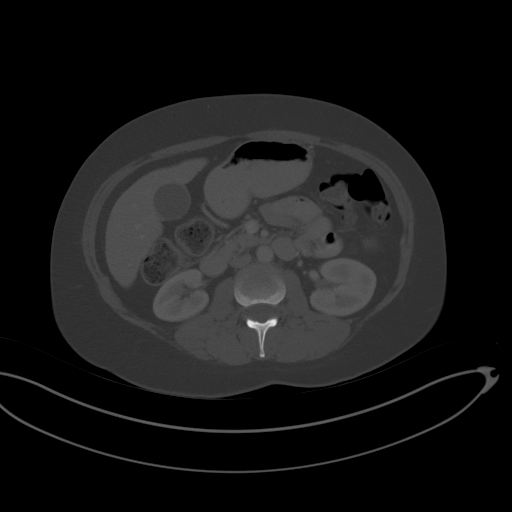
[im 67/95  soft-tissue]
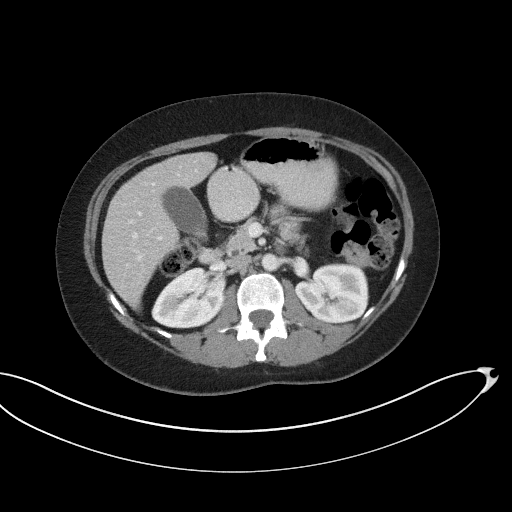
[im 75/95  soft-tissue]
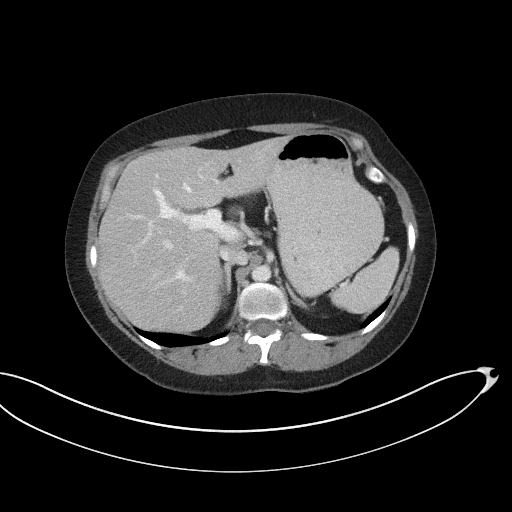
[im 83/95  soft-tissue]
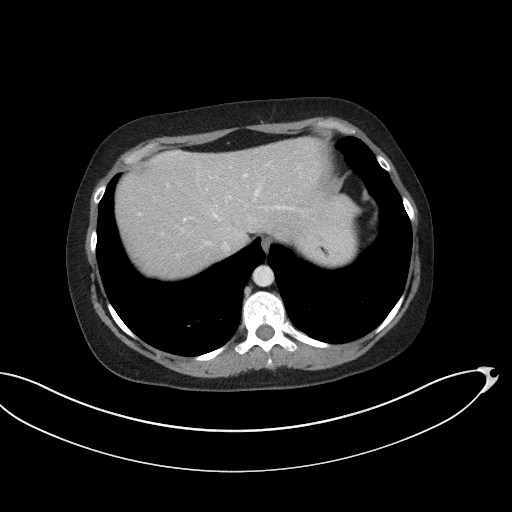
[im 91/95  soft-tissue]
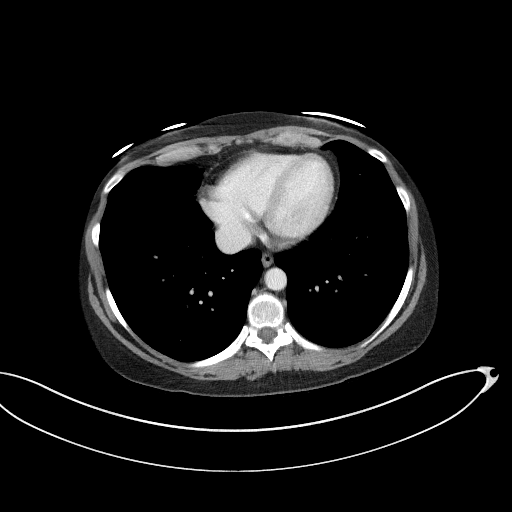

[Series 5: coronal st · coronal · 0.80mm/px · 3 of 84 slices shown]
[im 28/84  soft-tissue]
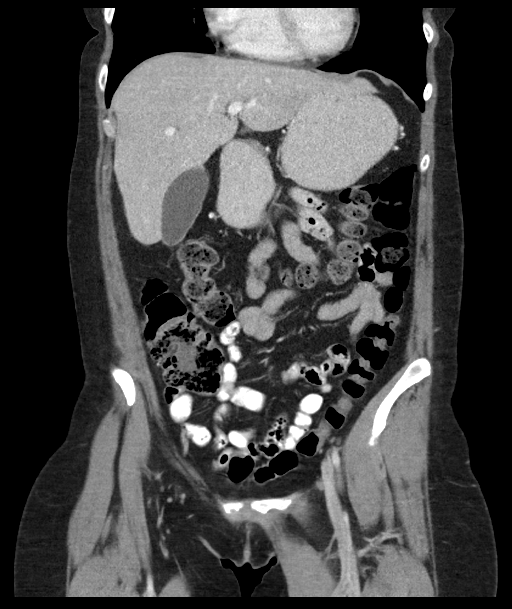
[im 37/84  soft-tissue]
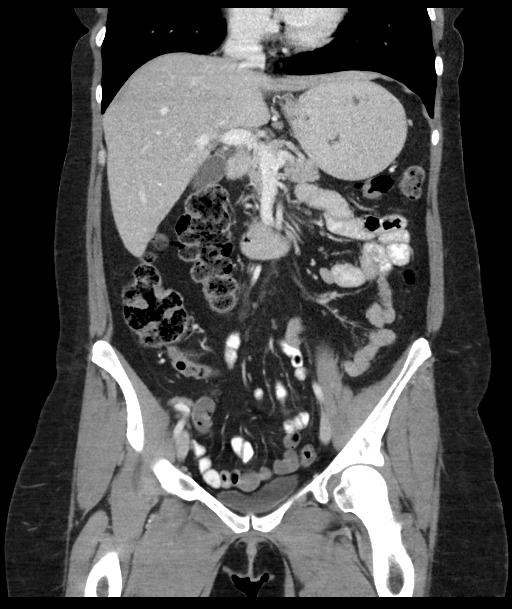
[im 47/84  soft-tissue]
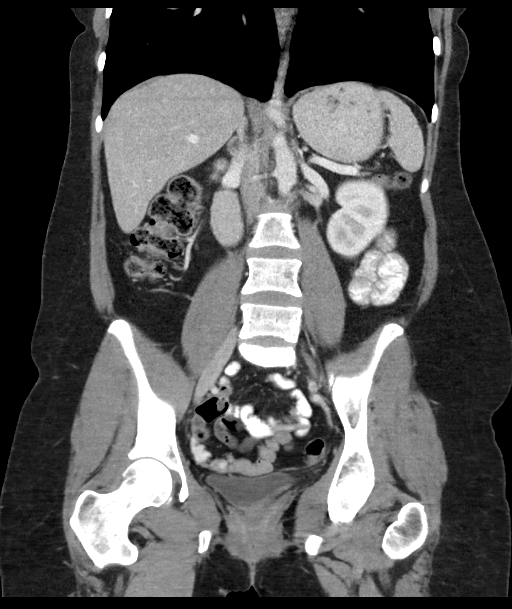

[16 of 46 positions shown; findings below may reference images not displayed]

FINDINGS: Lower chest: No acute abnormality.

Hepatobiliary: No focal liver abnormality is seen. No gallstones,
gallbladder wall thickening, or biliary dilatation.

Pancreas: Unremarkable. No pancreatic ductal dilatation or
surrounding inflammatory changes.

Spleen: Normal in size without focal abnormality.

Adrenals/Urinary Tract: Adrenal glands are unremarkable. Kidneys are
normal, without renal calculi, focal lesion, or hydronephrosis.
Bladder is unremarkable.

Stomach/Bowel: Stomach and small bowel are normal. Appendix is
normal. There is a mildly tethered appearance of the mid sigmoid to
the right of midline. The CT appearance is are most suggestive of
scarring, perhaps related to the history of endometriosis and prior
hysterectomy. Acute inflammation is less likely. No bowel
obstruction. No perforation. No pneumatosis.

Vascular/Lymphatic: No significant vascular findings are present. No
enlarged abdominal or pelvic lymph nodes.

Reproductive: Status post hysterectomy. No adnexal masses.

Other: No ascites.

Musculoskeletal: No significant skeletal lesion.
IMPRESSION: Normal appendix. Mildly tethered appearance of the mid sigmoid colon
to the right of midline, with findings more suggestive of chronic
scarring although a component of inflammation is not entirely
excluded. No obstruction or perforation. No abscess. No ascites.

## 2016-11-29 MED ORDER — OXYCODONE-ACETAMINOPHEN 5-325 MG PO TABS: 1.0000 | ORAL_TABLET | ORAL | 0 refills | Status: AC | PRN

## 2016-11-29 MED ORDER — OXYCODONE-ACETAMINOPHEN 5-325 MG PO TABS
1.0000 | ORAL_TABLET | Freq: Once | ORAL | Status: AC
  Administered 2016-11-29: 1 via ORAL
  Filled 2016-11-29: qty 1

## 2016-11-29 MED ORDER — IOPAMIDOL (ISOVUE-300) INJECTION 61%
100.0000 mL | Freq: Once | INTRAVENOUS | Status: AC | PRN
  Administered 2016-11-29: 100 mL via INTRAVENOUS

## 2017-04-17 ENCOUNTER — Emergency Department
Admission: EM | Admit: 2017-04-17 | Discharge: 2017-04-17 | Disposition: A | Discharge status: Home / Self Care | Payer: Medicaid Other | Attending: Emergency Medicine | Admitting: Emergency Medicine

## 2017-04-17 DIAGNOSIS — F17210 Nicotine dependence, cigarettes, uncomplicated: Secondary | ICD-10-CM | POA: Insufficient documentation

## 2017-04-17 DIAGNOSIS — R102 Pelvic and perineal pain: Secondary | ICD-10-CM

## 2017-04-17 DIAGNOSIS — N809 Endometriosis, unspecified: Secondary | ICD-10-CM | POA: Diagnosis not present

## 2017-04-17 DIAGNOSIS — Z79899 Other long term (current) drug therapy: Secondary | ICD-10-CM | POA: Insufficient documentation

## 2017-04-17 LAB — URINALYSIS, COMPLETE (UACMP) WITH MICROSCOPIC
BACTERIA UA: NONE SEEN
BILIRUBIN URINE: NEGATIVE
Glucose, UA: NEGATIVE mg/dL
HGB URINE DIPSTICK: NEGATIVE
Ketones, ur: NEGATIVE mg/dL
LEUKOCYTES UA: NEGATIVE
NITRITE: NEGATIVE
PROTEIN: NEGATIVE mg/dL
SPECIFIC GRAVITY, URINE: 1.023 (ref 1.005–1.030)
pH: 6 (ref 5.0–8.0)

## 2017-04-17 LAB — COMPREHENSIVE METABOLIC PANEL
ALT: 13 U/L — ABNORMAL LOW (ref 14–54)
AST: 16 U/L (ref 15–41)
Albumin: 4.2 g/dL (ref 3.5–5.0)
Alkaline Phosphatase: 56 U/L (ref 38–126)
Anion gap: 5 (ref 5–15)
BILIRUBIN TOTAL: 0.5 mg/dL (ref 0.3–1.2)
BUN: 11 mg/dL (ref 6–20)
CO2: 25 mmol/L (ref 22–32)
Calcium: 9.2 mg/dL (ref 8.9–10.3)
Chloride: 108 mmol/L (ref 101–111)
Creatinine, Ser: 0.84 mg/dL (ref 0.44–1.00)
GFR calc Af Amer: >60 mL/min (ref >60)
Glucose, Bld: 100 mg/dL — ABNORMAL HIGH (ref 65–99)
POTASSIUM: 3.9 mmol/L (ref 3.5–5.1)
Sodium: 138 mmol/L (ref 135–145)
TOTAL PROTEIN: 7 g/dL (ref 6.5–8.1)

## 2017-04-17 LAB — CBC
HEMATOCRIT: 40.5 % (ref 35.0–47.0)
Hemoglobin: 14 g/dL (ref 12.0–16.0)
MCH: 31.4 pg (ref 26.0–34.0)
MCHC: 34.4 g/dL (ref 32.0–36.0)
MCV: 91.3 fL (ref 80.0–100.0)
PLATELETS: 245 10*3/uL (ref 150–440)
RBC: 4.44 MIL/uL (ref 3.80–5.20)
RDW: 14.1 % (ref 11.5–14.5)
WBC: 8.7 10*3/uL (ref 3.6–11.0)

## 2017-04-17 LAB — LIPASE, BLOOD: Lipase: 17 U/L (ref 11–51)

## 2017-04-17 MED ORDER — HYDROMORPHONE HCL 1 MG/ML IJ SOLN
1.0000 mg | Freq: Once | INTRAMUSCULAR | Status: AC
  Administered 2017-04-17: 1 mg via INTRAMUSCULAR
  Filled 2017-04-17: qty 1

## 2017-04-17 MED ORDER — KETOROLAC TROMETHAMINE 60 MG/2ML IM SOLN
60.0000 mg | Freq: Once | INTRAMUSCULAR | Status: AC
  Administered 2017-04-17: 60 mg via INTRAMUSCULAR
  Filled 2017-04-17: qty 2

## 2017-04-17 MED ORDER — ETODOLAC 200 MG PO CAPS: 200.0000 mg | ORAL_CAPSULE | Freq: Three times a day (TID) | ORAL | 0 refills | Status: AC

## 2017-04-17 MED ORDER — TRAMADOL HCL 50 MG PO TABS: 50.0000 mg | ORAL_TABLET | Freq: Four times a day (QID) | ORAL | 0 refills | Status: AC | PRN

## 2017-04-17 NOTE — ED Notes (Signed)
MD Webster at bedside at this time.  

## 2017-04-17 NOTE — ED Triage Notes (Signed)
Pt presents to ED c/o of pelvic pain with known endometriosis that radiates to lower abdomen x2 days. Pt was taking oxycodone and OxyContin from pain clinic; pt now waiting for follow [facility].

## 2017-04-17 NOTE — Discharge Instructions (Signed)
Please follow-up with your primary care physician or with your OB/GYN. Given your chronic pain have attempted to treat her pain here but he will need to follow-up for long-term pain medication treatment. Please return to the emergency department with any new or worsening symptoms.

## 2017-04-17 NOTE — ED Provider Notes (Signed)
[facility] Emergency Department Provider Note   ____________________________________________   First MD Initiated Contact with Patient 04/17/17 [phone]     (approximate)  I have reviewed the triage vital signs and the nursing notes.   HISTORY  Chief Complaint Abdominal Pain; Pelvic Pain; and Endometriosis    HPI Sandy Cruz is a 36 y.o. female who comes into the hospital today with abdominal pain. The patient reports that this is her endometriosis pain. She reports that she is in the process of waiting on acceptance [facility]. She reports that she was going to the pain clinic in [place] but has been discharged from that clinic. She reports that she has been dealing with pain over the last 2 days although she stopped going to the pain clinic approximately 2 weeks ago. She reports that she was taking oxycodone and OxyContin and stretched out her medications up until about 3 days ago. She called the clinic [facility] on Monday to try to get in to see them but they report that they lost her paperwork. She reports that her doctors are trying to get her in but she couldn't tolerate the pain anymore. The patient rates her pain a 10 out of 10 in intensity. The patient decided to come into the hospital today for treatment of her chronic pelvic pain. She reports that it's on the left and sometimes on the right. She reports that she has been worked up multiple times and has had a hysterectomy previously to try to treat this pain.   Past Medical History:  Diagnosis Date  . Anxiety   . Back pain   . Endometriosis   . Headache(784.0)    pinched nerves in neck  . Ovarian cyst     Patient Active Problem List   Diagnosis Date Noted  . 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 Pelvic pain 05/01/2013  . Endometriosis 05/01/2013    Past Surgical History:  Procedure Laterality Date  . LAPAROSCOPY N/A 07/21/2013   Procedure:  LAPAROSCOPY OPERATIVE;  Surgeon: Reva Boresanya S Pratt, MD;  Location: [facility];  Service: Gynecology;  Laterality: N/A;  . PELVIC LAPAROSCOPY    . SALPINGOOPHORECTOMY Bilateral 07/21/2013   Procedure: SALPINGO OOPHORECTOMY  Bilateral;  Surgeon: Reva Boresanya S Pratt, MD;  Location: [facility];  Service: Gynecology;  Laterality: Bilateral;  . TONSILLECTOMY AND ADENOIDECTOMY    . URETHRA SURGERY    . VAGINAL DELIVERY  1997. 2011  . VAGINAL HYSTERECTOMY  06/17/2012    Prior to Admission medications   Medication Sig Start Date End Date Taking? Authorizing Provider  clonazePAM (KLONOPIN) 0.5 MG tablet Take 0.5 mg by mouth 2 (two) times daily as needed for anxiety.    [provider]  cyclobenzaprine (FLEXERIL) 10 MG tablet Take 10 mg by mouth 3 (three) times daily as needed for muscle spasms.    [provider]  estradiol (VIVELLE-DOT) 0.075 MG/24HR Place 1 patch onto the skin 2 (two) times a week. 08/29/13   Reva BoresPratt, Tanya S, MD  etodolac (LODINE) 200 MG capsule Take 1 capsule (200 mg total) by mouth every 8 (eight) hours. 04/17/17   Rebecka ApleyWebster, Allison P, MD  etodolac (LODINE) 400 MG tablet Take 400 mg by mouth 2 (two) times daily.    [provider]  gabapentin (NEURONTIN) 300 MG capsule TAKE ONE CAPSULE BY MOUTH THREE TIMES DAILY 02/25/14   Reva BoresPratt, Tanya S, MD  methocarbamol (ROBAXIN-750) 750 MG tablet Take 2 tablets (1,500 mg total) by mouth  4 (four) times daily. 05/03/16   Joni Reining, PA-C  methylPREDNISolone (MEDROL DOSEPAK) 4 MG TBPK tablet Take Tapered dose as directed 05/03/16   Joni Reining, PA-C  oxyCODONE-acetaminophen (PERCOCET) 5-325 MG per tablet Take 1 tablet by mouth every 4 (four) hours as needed. 08/20/15   Charlynne Pander, MD  oxyCODONE-acetaminophen (ROXICET) 5-325 MG tablet Take 1 tablet by mouth every 4 (four) hours as needed for severe pain. 11/29/16   Darci Current, MD  Rivaroxaban (XARELTO STARTER PACK) 15 & 20 MG TBPK Take as directed on package: Start with one 15mg   tablet by mouth twice a day with food. On Day 22, switch to one 20mg  tablet once a day with food. 08/20/15   Charlynne Pander, MD  traMADol (ULTRAM) 50 MG tablet Take 1 tablet (50 mg total) by mouth every 6 (six) hours as needed. 04/17/17   Rebecka Apley, MD    Allergies Hydrocodone  Family History  Problem Relation Age of Onset  . Ovarian cysts Mother   . Cervical cancer Mother   . Stomach cancer Maternal Grandmother   . Lung cancer Maternal Grandfather   . Heart disease Brother   . Lung cancer Maternal Uncle     Social History Social History  Substance Use Topics  . Smoking status: Current Every Day Smoker    Packs/day: 1.00    Years: 12.00    Types: Cigarettes  . Smokeless tobacco: Never Used  . Alcohol use No    Review of Systems  Constitutional: No fever/chills Eyes: No visual changes. ENT: No sore throat. Cardiovascular: Denies chest pain. Respiratory: Denies shortness of breath. Gastrointestinal:  abdominal pain.  No nausea, no vomiting.  No diarrhea.  No constipation. Genitourinary: Negative for dysuria. Musculoskeletal: Negative for back pain. Skin: Negative for rash. Neurological: Negative for headaches, focal weakness or numbness.   ____________________________________________   PHYSICAL EXAM:  VITAL SIGNS: ED Triage Vitals  Enc Vitals Group     BP 04/17/17 0036 130/89     Pulse Rate 04/17/17 0036 100     Resp 04/17/17 0036 19     Temp 04/17/17 0036 98.5 F (36.9 C)     Temp Source 04/17/17 0036 Oral     SpO2 04/17/17 0036 96 %     Weight 04/17/17 0037 140 lb (63.5 kg)     Height 04/17/17 0037 5\' 5"  (1.651 m)     Head Circumference --      Peak Flow --      Pain Score 04/17/17 0036 10     Pain Loc --      Pain Edu? --      Excl. in GC? --     Constitutional: Alert and oriented. Well appearing and in Mild distress. Eyes: Conjunctivae are normal. PERRL. EOMI. Head: Atraumatic. Nose: No congestion/rhinnorhea. Mouth/Throat: Mucous  membranes are moist.  Oropharynx non-erythematous. Cardiovascular: Normal rate, regular rhythm. Grossly normal heart sounds.  Good peripheral circulation. Respiratory: Normal respiratory effort.  No retractions. Lungs CTAB. Gastrointestinal: Soft with some mild left lower quadrant/adnexal tenderness to palpation. No distention. Positive bowel sounds Musculoskeletal: No lower extremity tenderness nor edema.   Neurologic:  Normal speech and language.  Skin:  Skin is warm, dry and intact.  Psychiatric: Mood and affect are normal.   ____________________________________________   LABS (all labs ordered are listed, but only abnormal results are displayed)  Labs Reviewed  COMPREHENSIVE METABOLIC PANEL - Abnormal; Notable for the following:  Result Value   Glucose, Bld 100 (*)    ALT 13 (*)    All other components within normal limits  URINALYSIS, COMPLETE (UACMP) WITH MICROSCOPIC - Abnormal; Notable for the following:    Color, Urine YELLOW (*)    APPearance CLEAR (*)    Squamous Epithelial / LPF 0-5 (*)    All other components within normal limits  LIPASE, BLOOD  CBC   ____________________________________________  EKG  none ____________________________________________  RADIOLOGY  none ____________________________________________   PROCEDURES  Procedure(s) performed: None  Procedures  Critical Care performed: No  ____________________________________________   INITIAL IMPRESSION / ASSESSMENT AND PLAN / ED COURSE  Pertinent labs & imaging results that were available during my care of the patient were reviewed by me and considered in my medical decision making (see chart for details).  This is a 36 year old female who comes into the hospital today with some chronic abdominal pain. The patient reports that she's having pain due to her endometriosis and she is been out of her pain medicine for 3 days. I did give the patient a shot of Toradol as well as a shot of  Dilaudid. I discussed with the patient the chronic pain policy and that we could not give her any narcotic pain medicines for home. The patient states that she was okay with this. I encouraged her to follow back up with her primary care physician or with the pain management physicians for further evaluation and management of her pelvic pain. The patient will be discharged home to follow-up.      ____________________________________________   FINAL CLINICAL IMPRESSION(S) / ED DIAGNOSES  Final diagnoses:  Pelvic pain in female  Endometriosis      NEW MEDICATIONS STARTED DURING THIS VISIT:  New Prescriptions   ETODOLAC (LODINE) 200 MG CAPSULE    Take 1 capsule (200 mg total) by mouth every 8 (eight) hours.   TRAMADOL (ULTRAM) 50 MG TABLET    Take 1 tablet (50 mg total) by mouth every 6 (six) hours as needed.     Note:  This document was prepared using Dragon voice recognition software and may include unintentional dictation errors.    Rebecka Apley, MD 04/17/17 [phone]

## 2017-04-17 NOTE — ED Notes (Signed)

## 2018-05-01 ENCOUNTER — Emergency Department
Admission: EM | Admit: 2018-05-01 | Discharge: 2018-05-01 | Disposition: A | Discharge status: Home / Self Care | Payer: Medicaid Other | Attending: Emergency Medicine | Admitting: Emergency Medicine

## 2018-05-01 ENCOUNTER — Other Ambulatory Visit: Payer: Self-pay

## 2018-05-01 DIAGNOSIS — Z79899 Other long term (current) drug therapy: Secondary | ICD-10-CM | POA: Insufficient documentation

## 2018-05-01 DIAGNOSIS — R102 Pelvic and perineal pain: Secondary | ICD-10-CM | POA: Insufficient documentation

## 2018-05-01 DIAGNOSIS — Z7901 Long term (current) use of anticoagulants: Secondary | ICD-10-CM | POA: Diagnosis not present

## 2018-05-01 DIAGNOSIS — F17210 Nicotine dependence, cigarettes, uncomplicated: Secondary | ICD-10-CM | POA: Diagnosis not present

## 2018-05-01 HISTORY — DX: Acute embolism and thrombosis of unspecified deep veins of left lower extremity: I82.402

## 2018-05-01 LAB — COMPREHENSIVE METABOLIC PANEL
ALK PHOS: 48 U/L (ref 38–126)
ALT: 11 U/L — AB (ref 14–54)
AST: 18 U/L (ref 15–41)
Albumin: 3.7 g/dL (ref 3.5–5.0)
Anion gap: 7 (ref 5–15)
BUN: 10 mg/dL (ref 6–20)
CALCIUM: 8.7 mg/dL — AB (ref 8.9–10.3)
CO2: 24 mmol/L (ref 22–32)
CREATININE: 0.78 mg/dL (ref 0.44–1.00)
Chloride: 106 mmol/L (ref 101–111)
GFR calc Af Amer: >60 mL/min (ref >60)
Glucose, Bld: 115 mg/dL — ABNORMAL HIGH (ref 65–99)
Potassium: 4 mmol/L (ref 3.5–5.1)
Sodium: 137 mmol/L (ref 135–145)
TOTAL PROTEIN: 6.4 g/dL — AB (ref 6.5–8.1)
Total Bilirubin: 0.3 mg/dL (ref 0.3–1.2)

## 2018-05-01 LAB — URINALYSIS, COMPLETE (UACMP) WITH MICROSCOPIC
BILIRUBIN URINE: NEGATIVE
Bacteria, UA: NONE SEEN
GLUCOSE, UA: NEGATIVE mg/dL
Hgb urine dipstick: NEGATIVE
KETONES UR: 5 mg/dL — AB
LEUKOCYTES UA: NEGATIVE
Nitrite: NEGATIVE
Protein, ur: NEGATIVE mg/dL
Specific Gravity, Urine: 1.021 (ref 1.005–1.030)
pH: 6 (ref 5.0–8.0)

## 2018-05-01 LAB — POCT PREGNANCY, URINE: Preg Test, Ur: NEGATIVE

## 2018-05-01 LAB — CBC
HCT: 38.4 % (ref 35.0–47.0)
Hemoglobin: 13.3 g/dL (ref 12.0–16.0)
MCH: 31.6 pg (ref 26.0–34.0)
MCHC: 34.7 g/dL (ref 32.0–36.0)
MCV: 90.9 fL (ref 80.0–100.0)
Platelets: 255 10*3/uL (ref 150–440)
RBC: 4.22 MIL/uL (ref 3.80–5.20)
RDW: 13.4 % (ref 11.5–14.5)
WBC: 6.4 10*3/uL (ref 3.6–11.0)

## 2018-05-01 LAB — LIPASE, BLOOD: Lipase: 25 U/L (ref 11–51)

## 2018-05-01 MED ORDER — OXYCODONE-ACETAMINOPHEN 5-325 MG PO TABS
1.0000 | ORAL_TABLET | ORAL | Status: DC | PRN
  Administered 2018-05-01: 1 via ORAL

## 2018-05-01 MED ORDER — OXYCODONE-ACETAMINOPHEN 5-325 MG PO TABS
ORAL_TABLET | ORAL | Status: AC
  Filled 2018-05-01: qty 1

## 2018-05-01 MED ORDER — MORPHINE SULFATE (PF) 4 MG/ML IV SOLN
4.0000 mg | Freq: Once | INTRAVENOUS | Status: AC
  Administered 2018-05-01: 4 mg via INTRAMUSCULAR
  Filled 2018-05-01: qty 1

## 2018-05-01 NOTE — ED Provider Notes (Signed)
[facility] Emergency Department Provider Note  Time seen: 5:44 PM  I have reviewed the triage vital signs and the nursing notes.   HISTORY  Chief Complaint Pelvic Pain    HPI Sandy Cruz is a 37 y.o. female with a past medical history of anxiety, endometriosis status post hysterectomy and oophorectomy, presents the emergency department for lower abdominal/pelvic pain.  According to the patient she has chronic pelvic pain.  States she was released from the pain clinic because she had a $60 bill that she refused to pay because she did not believe she of the money.  Patient states she is currently trying to get in with [facility] pain management.  States for the past several days she has been having increasing lower abdominal/pelvic pain.  Denies any dysuria but states it does hurt to push her urine out but hurts more in the pelvis.  Denies any fever.  No nausea or vomiting or diarrhea.  States her pain is usually more on the left but today it is more on the right.  Patient also states she ran out of her last pain pill this morning.   Past Medical History:  Diagnosis Date  . Anxiety   . Back pain   . Deep vein blood clot of left lower extremity (HCC)   . Endometriosis   . Headache(784.0)    pinched nerves in neck  . Ovarian cyst     Patient Active Problem List   Diagnosis Date Noted  . 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 Pelvic pain 05/01/2013  . Endometriosis 05/01/2013    Past Surgical History:  Procedure Laterality Date  . LAPAROSCOPY N/A 07/21/2013   Procedure: LAPAROSCOPY OPERATIVE;  Surgeon: Reva Bores, MD;  Location: [facility];  Service: Gynecology;  Laterality: N/A;  . PELVIC LAPAROSCOPY    . SALPINGOOPHORECTOMY Bilateral 07/21/2013   Procedure: SALPINGO OOPHORECTOMY  Bilateral;  Surgeon: Reva Bores, MD;  Location: [facility];  Service: Gynecology;  Laterality: Bilateral;  . TONSILLECTOMY AND ADENOIDECTOMY    . URETHRA SURGERY    . VAGINAL  DELIVERY  1997. 2011  . VAGINAL HYSTERECTOMY  06/17/2012    Prior to Admission medications   Medication Sig Start Date End Date Taking? Authorizing Provider  clonazePAM (KLONOPIN) 0.5 MG tablet Take 0.5 mg by mouth 2 (two) times daily as needed for anxiety.    [provider]  cyclobenzaprine (FLEXERIL) 10 MG tablet Take 10 mg by mouth 3 (three) times daily as needed for muscle spasms.    [provider]  estradiol (VIVELLE-DOT) 0.075 MG/24HR Place 1 patch onto the skin 2 (two) times a week. 08/29/13   Reva Bores, MD  etodolac (LODINE) 200 MG capsule Take 1 capsule (200 mg total) by mouth every 8 (eight) hours. 04/17/17   Rebecka Apley, MD  etodolac (LODINE) 400 MG tablet Take 400 mg by mouth 2 (two) times daily.    [provider]  gabapentin (NEURONTIN) 300 MG capsule TAKE ONE CAPSULE BY MOUTH THREE TIMES DAILY 02/25/14   Reva Bores, MD  methocarbamol (ROBAXIN-750) 750 MG tablet Take 2 tablets (1,500 mg total) by mouth 4 (four) times daily. 05/03/16   Joni Reining, PA-C  methylPREDNISolone (MEDROL DOSEPAK) 4 MG TBPK tablet Take Tapered dose as directed 05/03/16   Joni Reining, PA-C  oxyCODONE-acetaminophen (PERCOCET) 5-325 MG per tablet Take 1 tablet by mouth every 4 (four) hours as needed. 08/20/15   Charlynne Pander, MD  oxyCODONE-acetaminophen (  ROXICET) 5-325 MG tablet Take 1 tablet by mouth every 4 (four) hours as needed for severe pain. 11/29/16   Darci Current, MD  Rivaroxaban (XARELTO STARTER PACK) 15 & 20 MG TBPK Take as directed on package: Start with one 15mg  tablet by mouth twice a day with food. On Day 22, switch to one 20mg  tablet once a day with food. 08/20/15   Charlynne Pander, MD  traMADol (ULTRAM) 50 MG tablet Take 1 tablet (50 mg total) by mouth every 6 (six) hours as needed. 04/17/17   Rebecka Apley, MD    Allergies  Allergen Reactions  . Hydrocodone Itching    Family History  Problem Relation Age of Onset  . Ovarian cysts  Mother   . Cervical cancer Mother   . Stomach cancer Maternal Grandmother   . Lung cancer Maternal Grandfather   . Heart disease Brother   . Lung cancer Maternal Uncle     Social History Social History   Tobacco Use  . Smoking status: Current Every Day Smoker    Packs/day: 1.00    Years: 12.00    Pack years: 12.00    Types: Cigarettes  . Smokeless tobacco: Never Used  Substance Use Topics  . Alcohol use: No  . Drug use: No    Review of Systems Constitutional: Negative for fever. Eyes: Negative for visual complaints ENT: Negative for recent illness/congestion Cardiovascular: Negative for chest pain. Respiratory: Negative for shortness of breath. Gastrointestinal: Lower abdominal/pelvic pain.  Negative for nausea vomiting or diarrhea. Genitourinary: Negative for urinary compaints Musculoskeletal: Negative for musculoskeletal complaints Skin: Negative for skin complaints  Neurological: Negative for headache All other ROS negative  ____________________________________________   PHYSICAL EXAM:  VITAL SIGNS: ED Triage Vitals [05/01/18 1627]  Enc Vitals Group     BP 137/88     Pulse Rate 63     Resp 18     Temp 98.3 F (36.8 C)     Temp Source Oral     SpO2 98 %     Weight 140 lb (63.5 kg)     Height 5\' 3"  (1.6 m)     Head Circumference      Peak Flow      Pain Score 10     Pain Loc      Pain Edu?      Excl. in GC?     Constitutional: Alert and oriented. Well appearing and in no distress. Eyes: Normal exam ENT   Head: Normocephalic and atraumatic.   Mouth/Throat: Mucous membranes are moist. Cardiovascular: Normal rate, regular rhythm. No murmur Respiratory: Normal respiratory effort without tachypnea nor retractions. Breath sounds are clear  Gastrointestinal: Soft, mild suprapubic tenderness to palpation, mild lower abdominal tenderness and right lower left lower quadrants.  No rebound or guarding.  No distention. Musculoskeletal: Nontender with  normal range of motion in all extremities. No lower extremity tenderness or edema. Neurologic:  Normal speech and language. No gross focal neurologic deficits Skin:  Skin is warm, dry and intact.  Psychiatric: Mood and affect are normal.   ____________________________________________   INITIAL IMPRESSION / ASSESSMENT AND PLAN / ED COURSE  Pertinent labs & imaging results that were available during my care of the patient were reviewed by me and considered in my medical decision making (see chart for details).  Patient presents to the emergency department for lower abdominal discomfort.  Differential would include pelvic pain, chronic pain, UTI, colitis or diverticulitis.  Overall the patient appears very well,  she is status post hysterectomy and bilateral oophorectomy.  Patient's vitals are normal, afebrile, labs are normal including normal white blood cell count.  Exam is largely benign besides mild lower abdominal pain.  Patient admits to chronic pelvic pain.  We will dose a one-time dose of pain medication while awaiting the rest of the lab results.  I reviewed the patient's narcotic records, she has significant past history for narcotic medications.  After reviewing these we will not be prescribing home pain medication for the patient.  I discussed she will need to follow-up with pain management.  If the patient's urinalysis is normal anticipate likely discharge home with PCP follow-up and pain management follow-up as available.  Analysis largely negative.  Highly suspect chronic pelvic pain.  No signs of acute abnormality.  Reassuring labs and vitals.  We will discharge patient home with PCP follow-up.  ____________________________________________   FINAL CLINICAL IMPRESSION(S) / ED DIAGNOSES  Pelvic pain    Minna AntisPaduchowski, Sandrina Heaton, MD 05/01/18 1857

## 2018-05-01 NOTE — ED Triage Notes (Signed)
Pt states she has had hysterectomy and ovaries removed. States always hurts on R side but yesterday pain became worse. Appears uncomfortable. Denies urinary symptoms. Denies vaginal bleeding. Denies N&V&D. Took pain medicine this AM.

## 2018-05-27 ENCOUNTER — Encounter: Payer: Self-pay | Admitting: Certified Nurse Midwife

## 2018-07-01 ENCOUNTER — Encounter: Payer: Self-pay | Admitting: Certified Nurse Midwife

## 2021-08-10 ENCOUNTER — Other Ambulatory Visit: Payer: Self-pay

## 2021-08-10 ENCOUNTER — Emergency Department: Payer: Medicaid Other

## 2021-08-10 ENCOUNTER — Emergency Department
Admission: EM | Admit: 2021-08-10 | Discharge: 2021-08-10 | Disposition: A | Discharge status: Home / Self Care | Payer: Medicaid Other | Attending: Emergency Medicine | Admitting: Emergency Medicine

## 2021-08-10 DIAGNOSIS — M79605 Pain in left leg: Secondary | ICD-10-CM | POA: Insufficient documentation

## 2021-08-10 DIAGNOSIS — Z7901 Long term (current) use of anticoagulants: Secondary | ICD-10-CM | POA: Diagnosis not present

## 2021-08-10 DIAGNOSIS — R000 Tachycardia, unspecified: Secondary | ICD-10-CM | POA: Insufficient documentation

## 2021-08-10 DIAGNOSIS — F17210 Nicotine dependence, cigarettes, uncomplicated: Secondary | ICD-10-CM | POA: Insufficient documentation

## 2021-08-10 DIAGNOSIS — L03116 Cellulitis of left lower limb: Secondary | ICD-10-CM

## 2021-08-10 DIAGNOSIS — R0602 Shortness of breath: Secondary | ICD-10-CM | POA: Insufficient documentation

## 2021-08-10 LAB — CBC
HCT: 40.2 % (ref 36.0–46.0)
Hemoglobin: 14 g/dL (ref 12.0–15.0)
MCH: 30 pg (ref 26.0–34.0)
MCHC: 34.8 g/dL (ref 30.0–36.0)
MCV: 86.3 fL (ref 80.0–100.0)
Platelets: 309 K/uL (ref 150–400)
RBC: 4.66 MIL/uL (ref 3.87–5.11)
RDW: 13.7 % (ref 11.5–15.5)
WBC: 10 K/uL (ref 4.0–10.5)
nRBC: 0 % (ref 0.0–0.2)

## 2021-08-10 LAB — BASIC METABOLIC PANEL
Anion gap: 6 (ref 5–15)
BUN: 13 mg/dL (ref 6–20)
CO2: 29 mmol/L (ref 22–32)
Calcium: 8.9 mg/dL (ref 8.9–10.3)
Chloride: 102 mmol/L (ref 98–111)
Creatinine, Ser: 0.69 mg/dL (ref 0.44–1.00)
GFR, Estimated: >60 mL/min (ref >60)
Glucose, Bld: 109 mg/dL — ABNORMAL HIGH (ref 70–99)
Potassium: 3.8 mmol/L (ref 3.5–5.1)
Sodium: 137 mmol/L (ref 135–145)

## 2021-08-10 LAB — D-DIMER, QUANTITATIVE: D-Dimer, Quant: 1.19 ug{FEU}/mL — ABNORMAL HIGH (ref 0.00–0.50)

## 2021-08-10 IMAGING — CT CT ANGIO CHEST
2 of 6 series · 18 of 46 positions shown · IV contrast (APPLIED)
Comparison: CT abdomen pelvis 11/29/2016

CLINICAL DATA: Shortness of breath.  Elevated D-dimer.

EXAM:
CT ANGIOGRAPHY CHEST WITH CONTRAST
TECHNIQUE: Multidetector CT imaging of the chest was performed using the
standard protocol during bolus administration of intravenous
contrast. Multiplanar CT image reconstructions and MIPs were
obtained to evaluate the vascular anatomy.
CONTRAST:  75mL OMNIPAQUE IOHEXOL 350 MG/ML SOLN

[Series 5: thins · axial · 0.74mm/px · z∈[-727,-474]mm · 15 of 346 slices shown]
[im 15/346  lung]
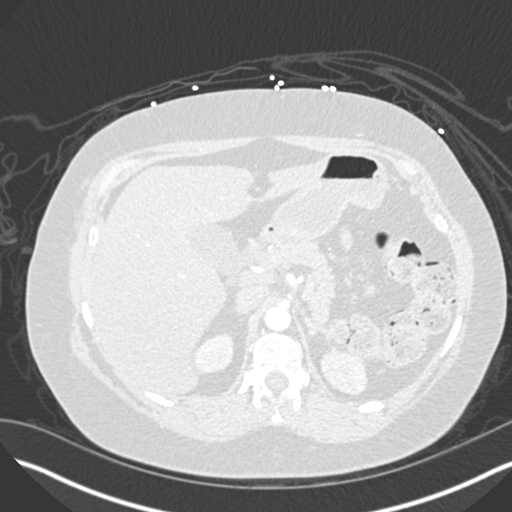
[im 44/346  soft-tissue]
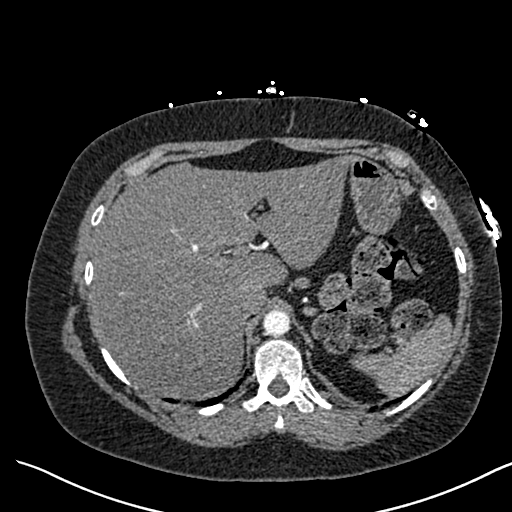
[im 58/346  lung]
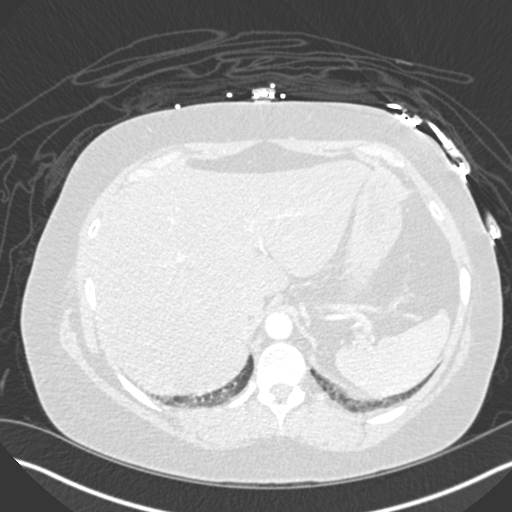
[im 87/346  soft-tissue]
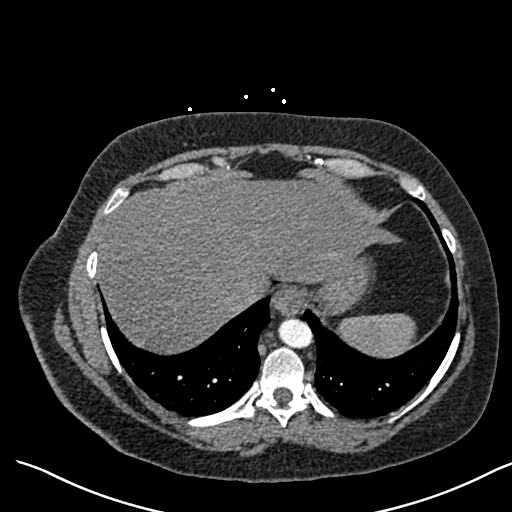
[im 101/346  lung]
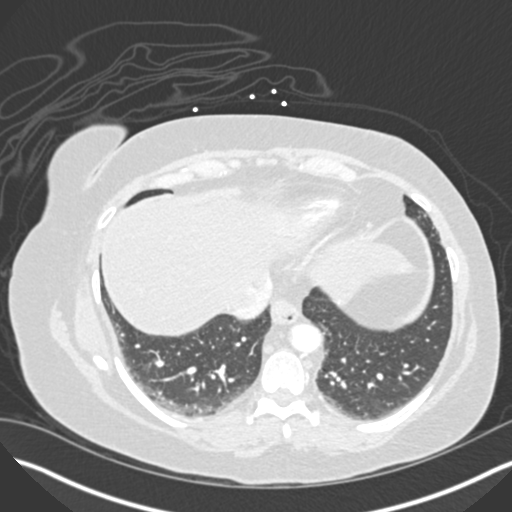
[im 130/346  soft-tissue]
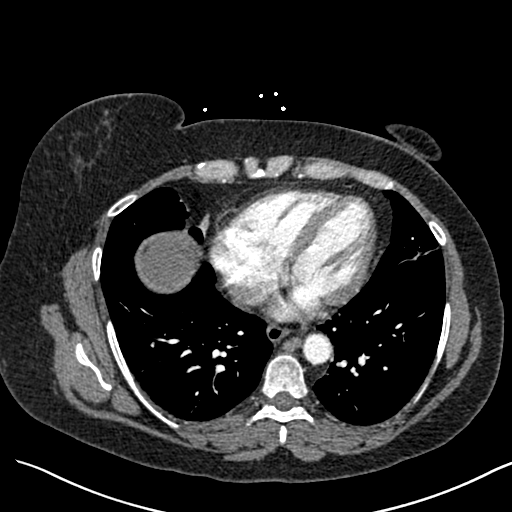
[im 144/346  lung]
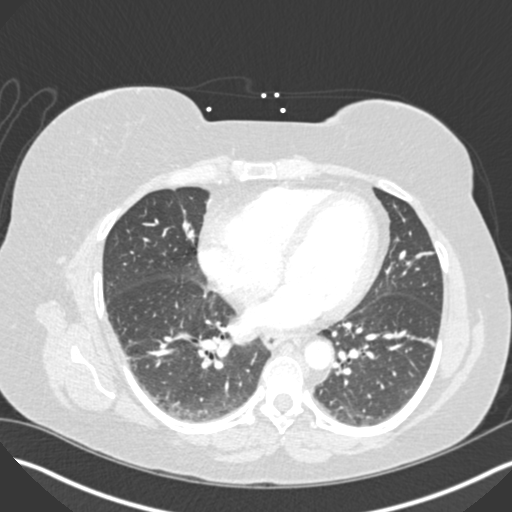
[im 173/346  soft-tissue]
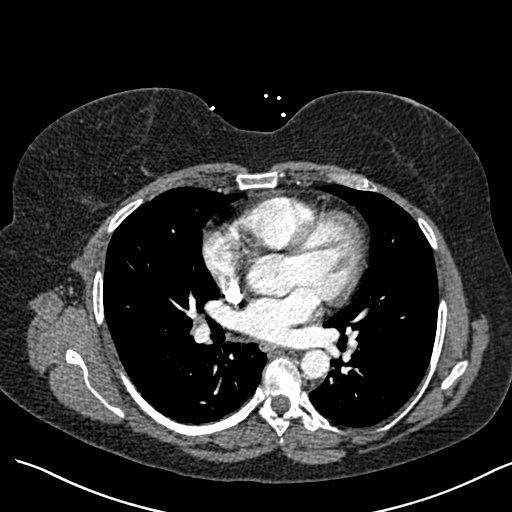
[im 202/346  lung]
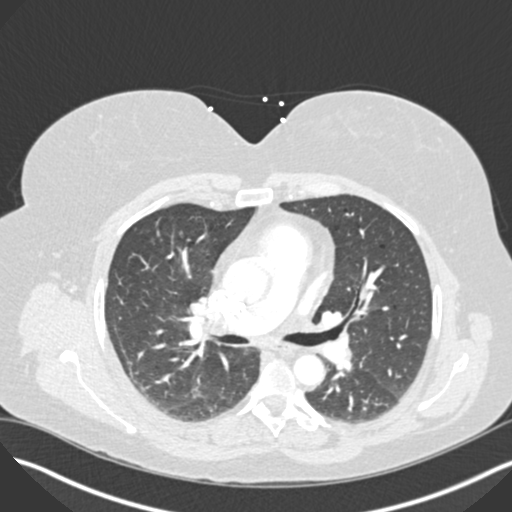
[im 216/346  soft-tissue]
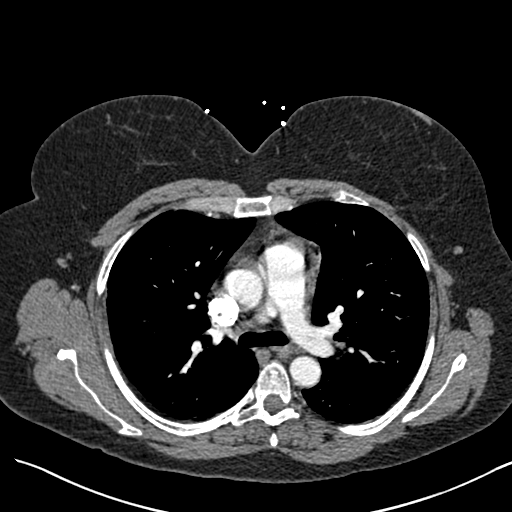
[im 245/346  lung]
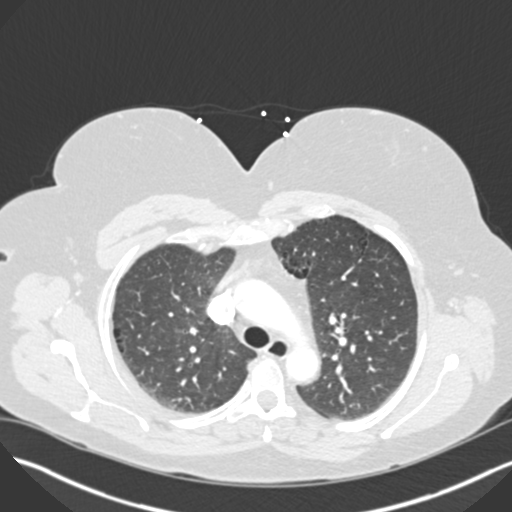
[im 259/346  soft-tissue]
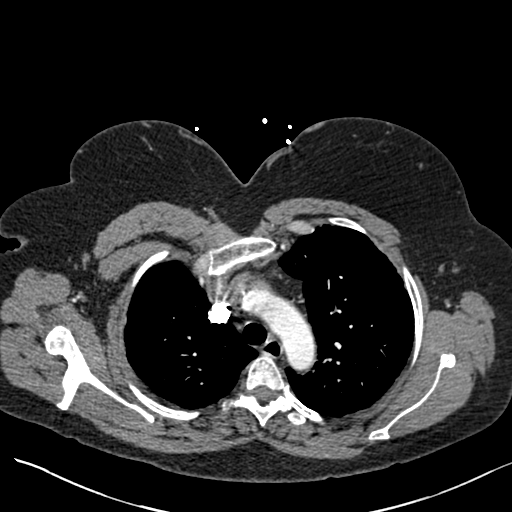
[im 288/346  lung]
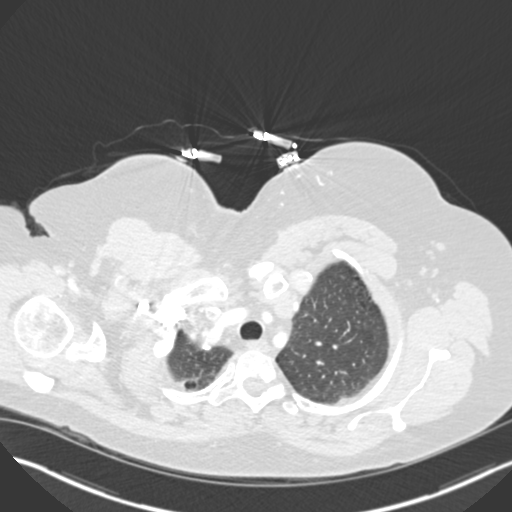
[im 302/346  soft-tissue]
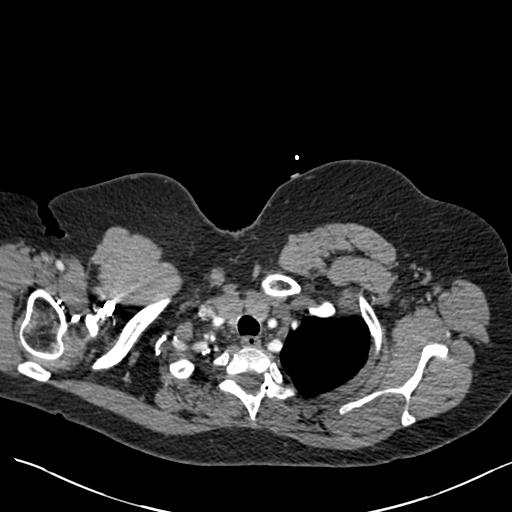
[im 331/346  lung]
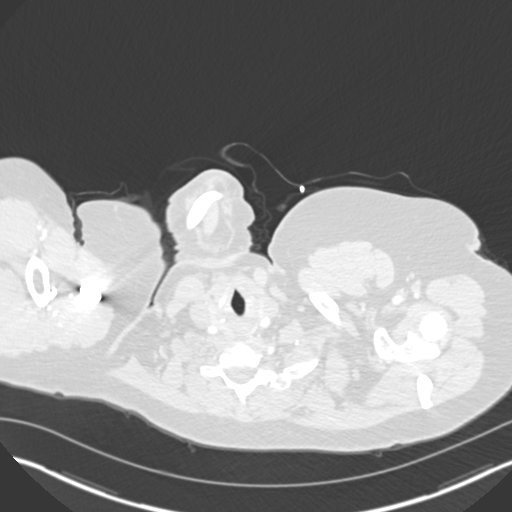

[Series 7: coronal mpr · coronal · 0.54mm/px · 3 of 105 slices shown]
[im 27/105  soft-tissue]
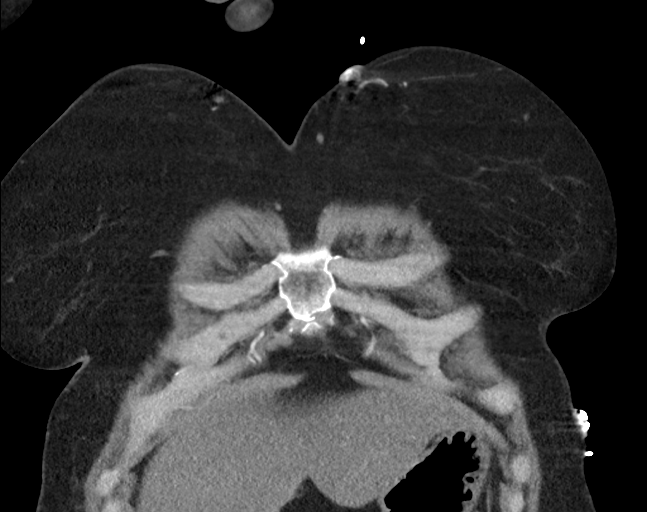
[im 53/105  soft-tissue]
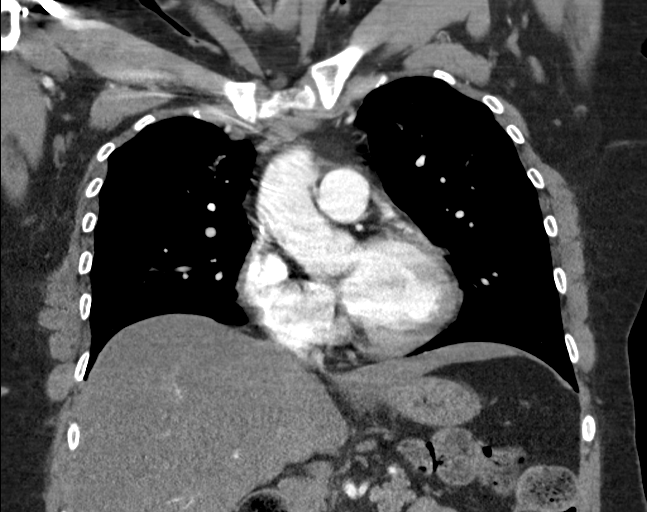
[im 79/105  soft-tissue]
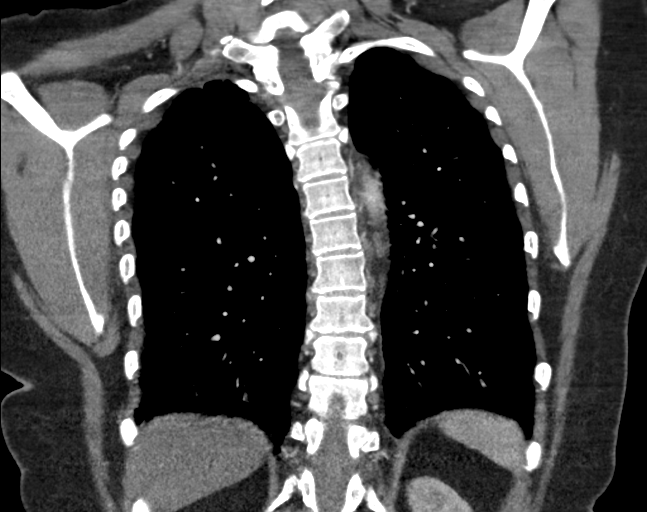

[18 of 46 positions shown; findings below may reference images not displayed]

FINDINGS: Cardiovascular: Fairly satisfactory opacification of the pulmonary
arteries to the segmental level. Limited evaluation of the
subsegmental level due to timing of intravenous contrast. No
evidence of pulmonary embolism. Normal heart size. No significant
pericardial effusion. The thoracic aorta is normal in caliber. No
atherosclerotic plaque of the thoracic aorta. No coronary artery
calcifications.

Mediastinum/Nodes: No enlarged mediastinal, hilar, or axillary lymph
nodes. Thyroid gland, trachea, and esophagus demonstrate no
significant findings. Possible tiny hiatal hernia.

Lungs/Pleura: Paraseptal emphysematous changes. Bilateral lower lobe
subsegmental atelectasis. Left lower lobe linear atelectasis. No
focal consolidation. No acute abnormality. No pulmonary mass. No
pleural effusion. No pneumothorax.

Upper Abdomen: No acute abnormality.

Musculoskeletal:

No chest wall abnormality.

No suspicious lytic or blastic osseous lesions. No acute displaced
fracture. Multilevel degenerative changes of the spine.

Review of the MIP images confirms the above findings.
IMPRESSION: 1. No central or segmental acute cardiopulmonary abnormality.
2. No acute intrathoracic abnormality.
3. Emphysema ([id number]).

## 2021-08-10 IMAGING — CR DG CHEST 2V
2 series · 2 of 2 positions shown · non-contrast
Comparison: Radiographs 02/23/2009. More recent study 03/18/2017
unavailable.

CLINICAL DATA: Shortness of breath. Bilateral leg pain for a few
days. Left leg erythema. History of DVT.

EXAM:
CHEST - 2 VIEW

[chest pa]
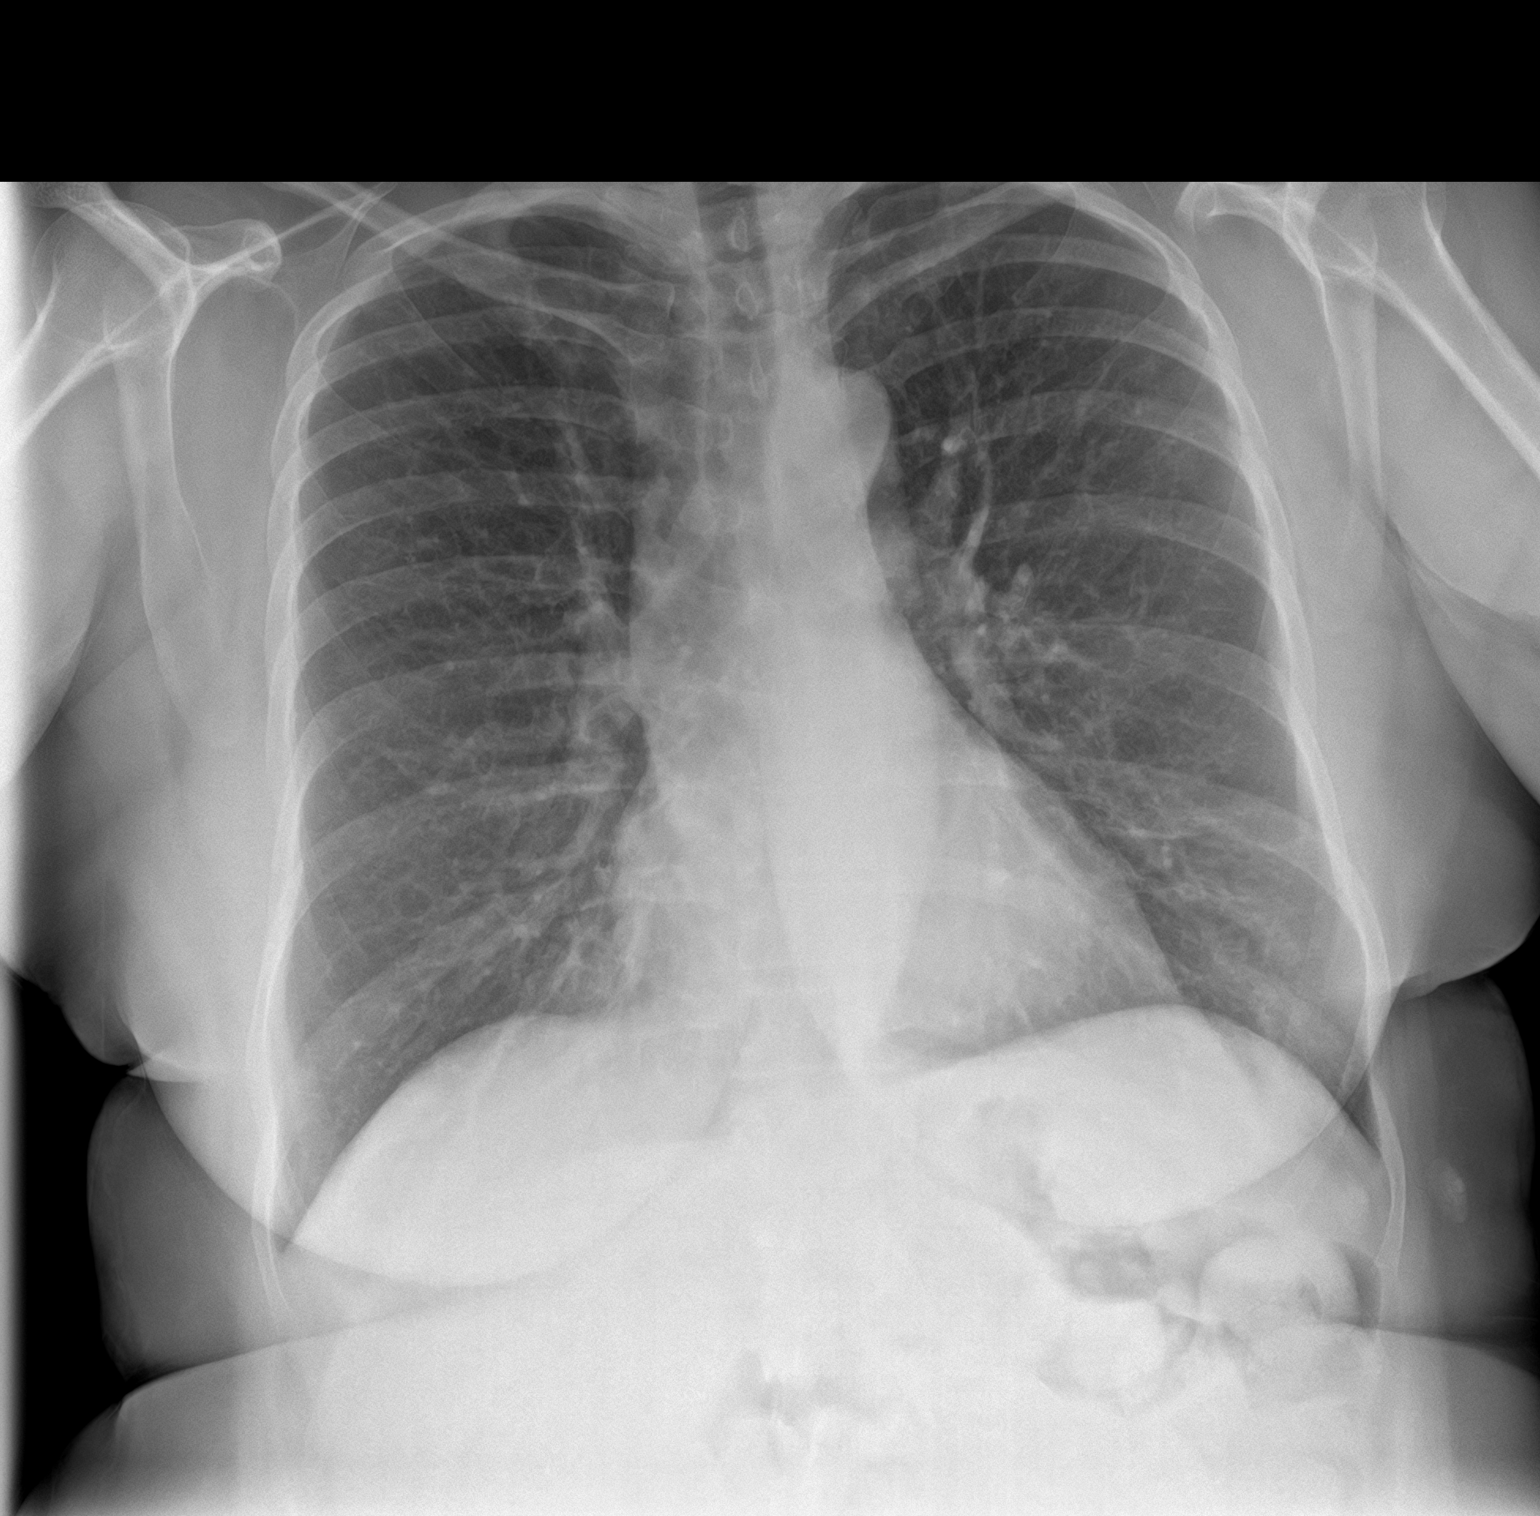

[chest lat]
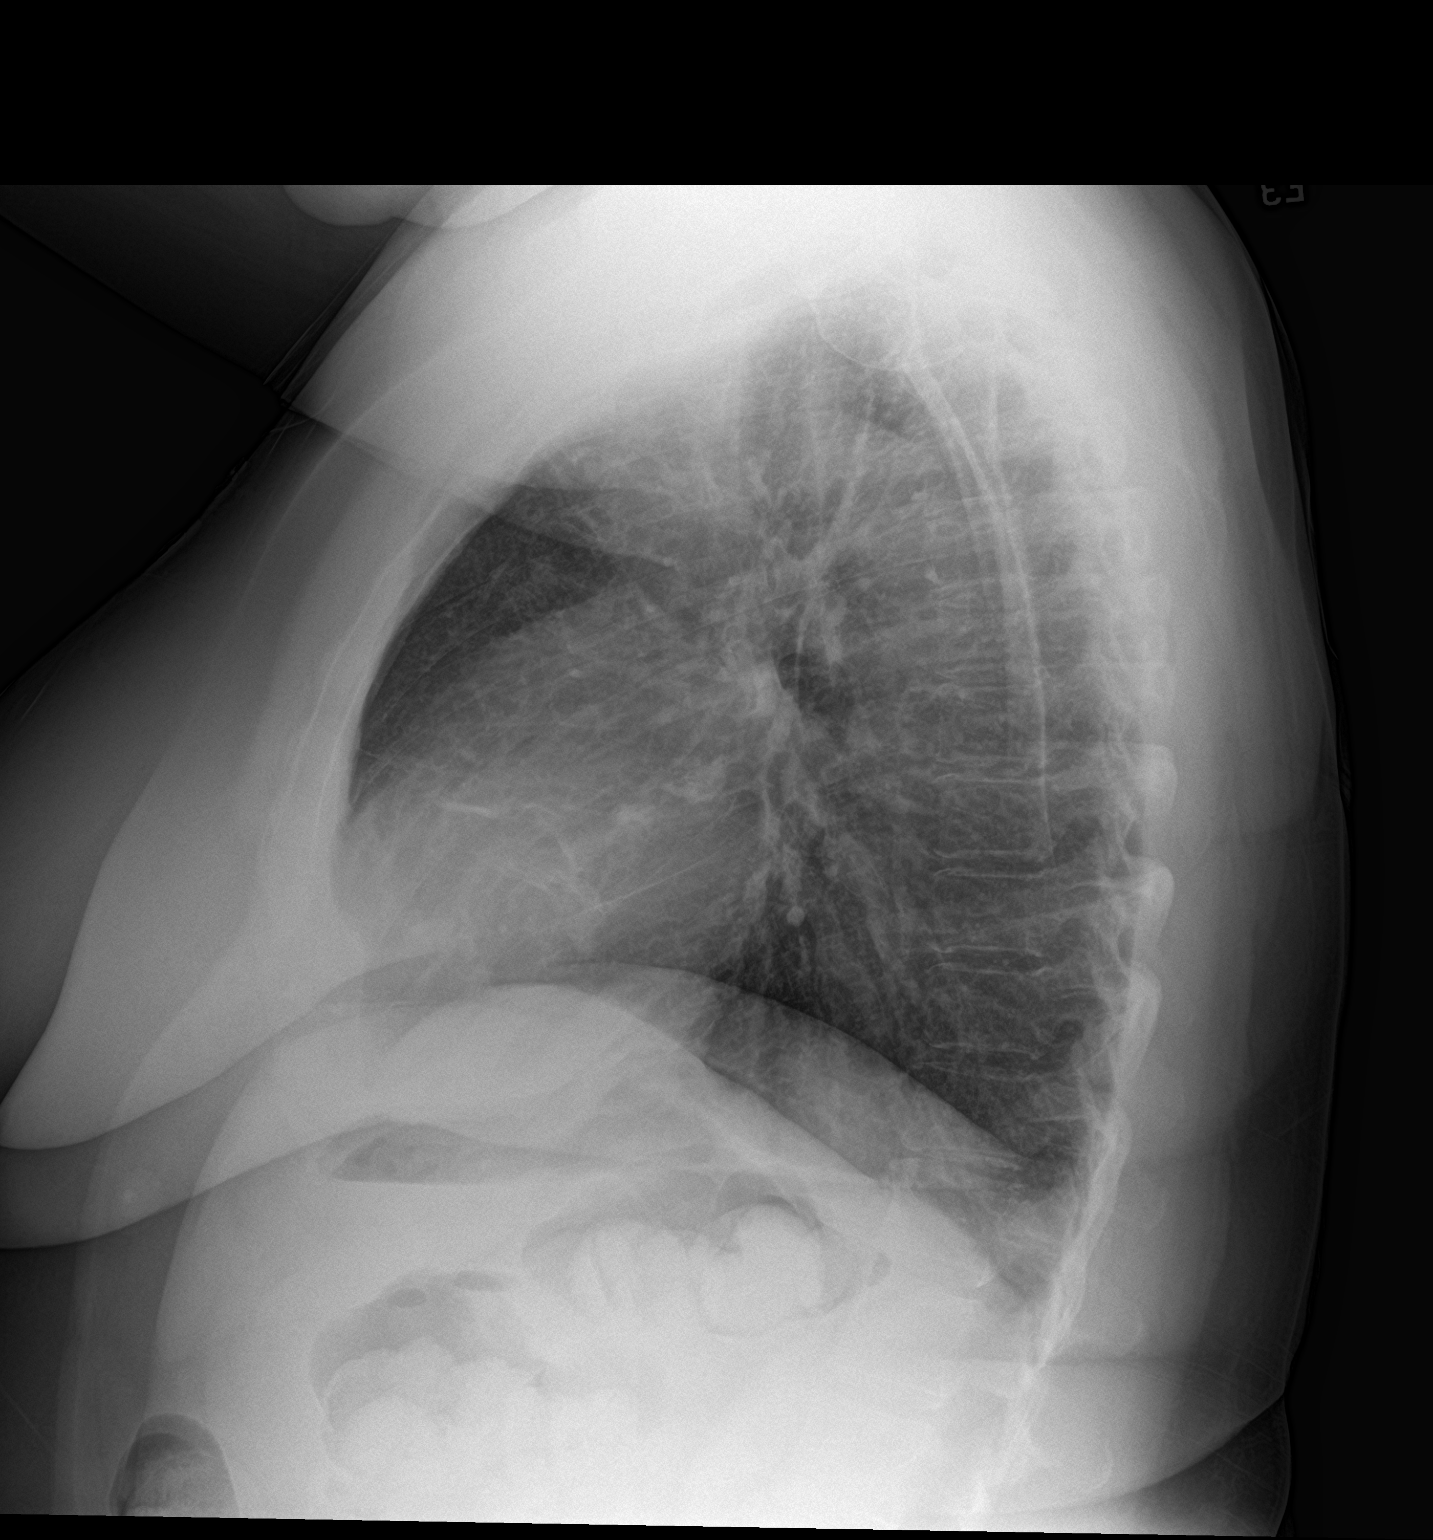

[2 of 2 positions shown; findings below may reference images not displayed]

FINDINGS: The heart size and mediastinal contours are stable. There is
probable linear scarring within the right middle lobe and lingula.
No edema, confluent airspace opacity, pleural effusion or
pneumothorax. The bones appear unremarkable.
IMPRESSION: No evidence of active cardiopulmonary process. Mild scarring in the
right middle lobe and lingula.

## 2021-08-10 IMAGING — US US EXTREM LOW VENOUS
1 series · 13 of 24 positions shown · non-contrast
Comparison: None.

CLINICAL DATA: sob



[Series 1: us venous img lower bilat (dvt) · portal-venous · 13 of 83 slices shown]
[im 1/83]
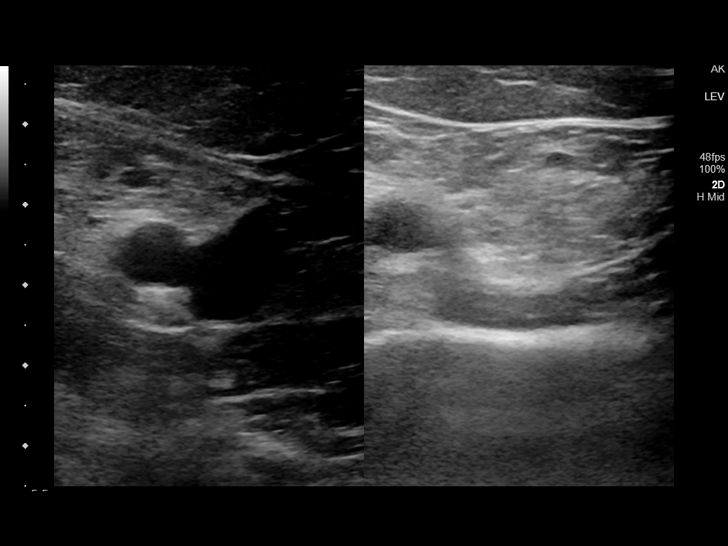
[im 8/83]
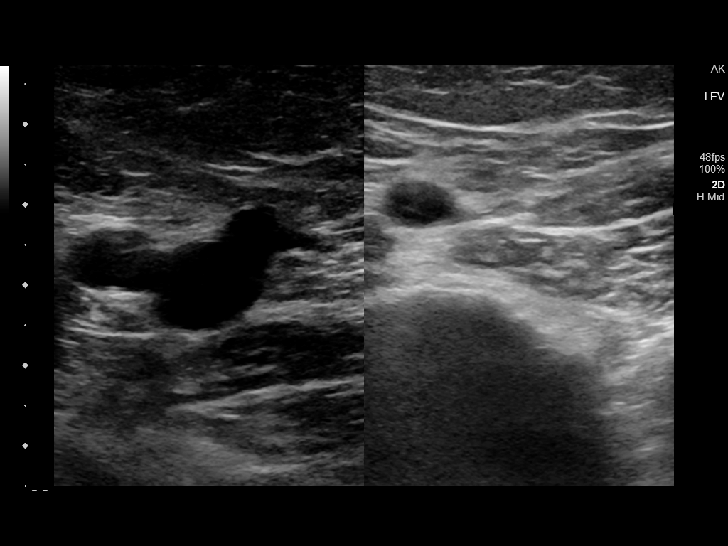
[im 15/83]
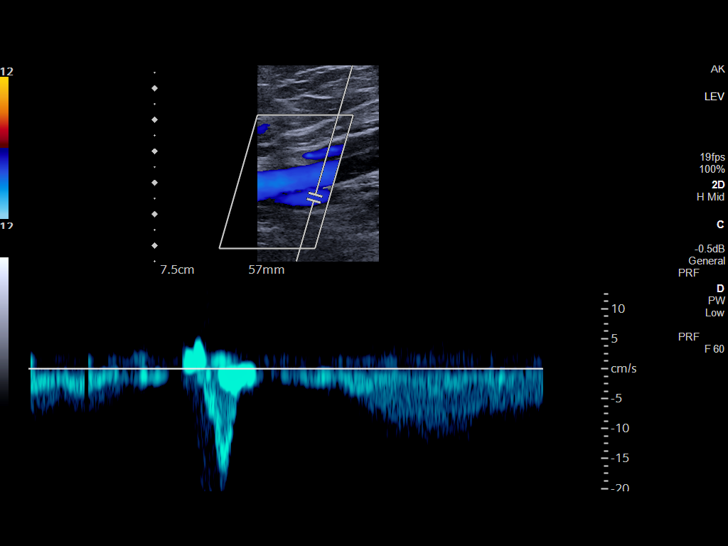
[im 22/83]
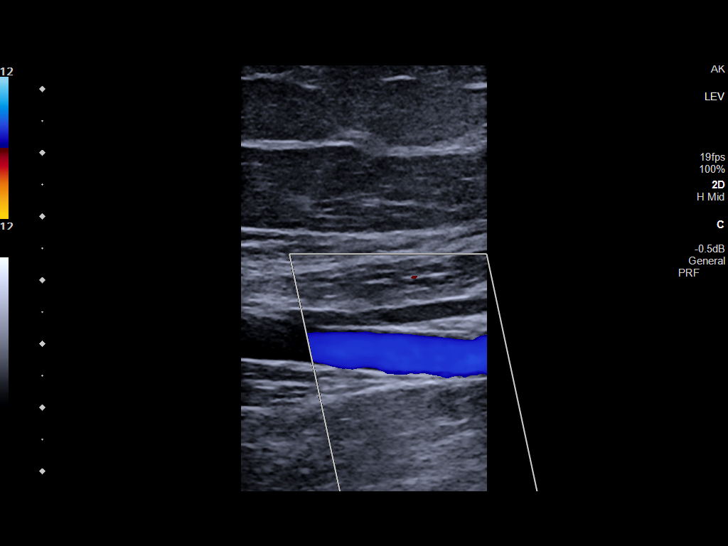
[im 29/83]
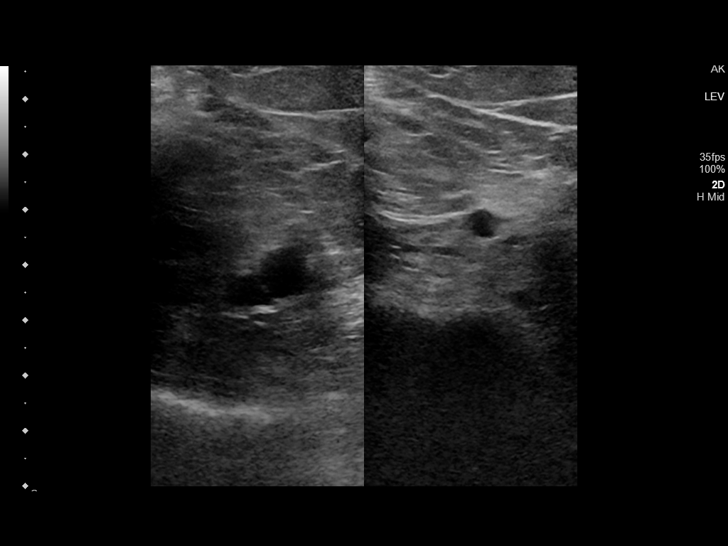
[im 36/83]
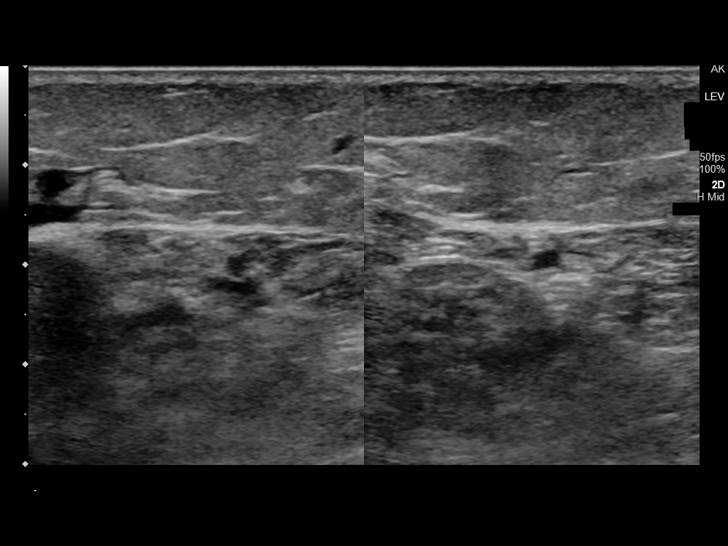
[im 43/83]
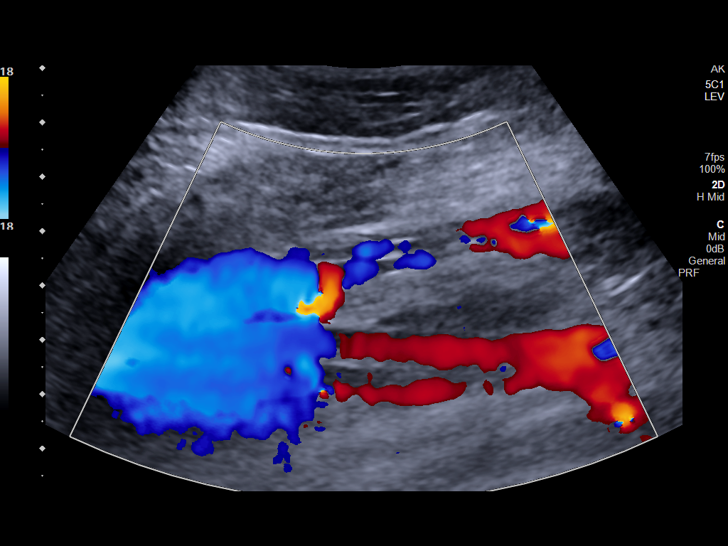
[im 47/83]
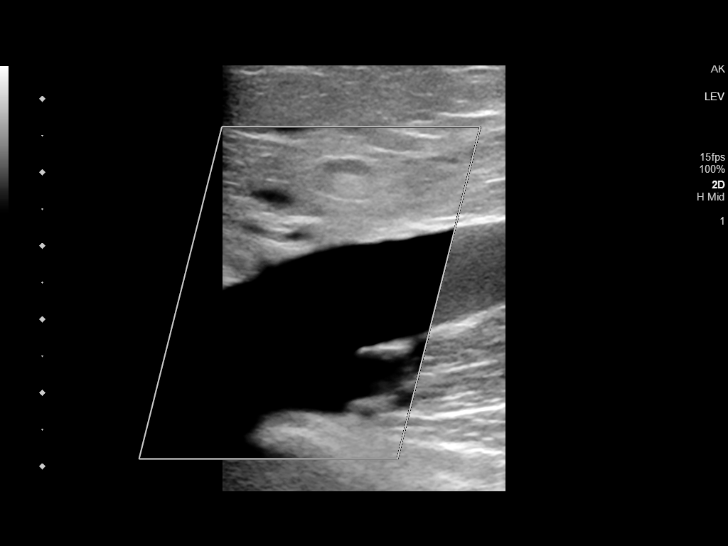
[im 54/83]
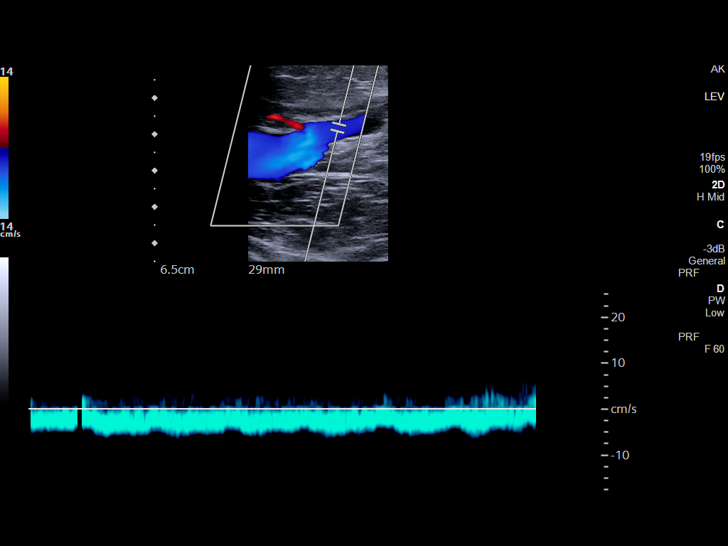
[im 61/83]
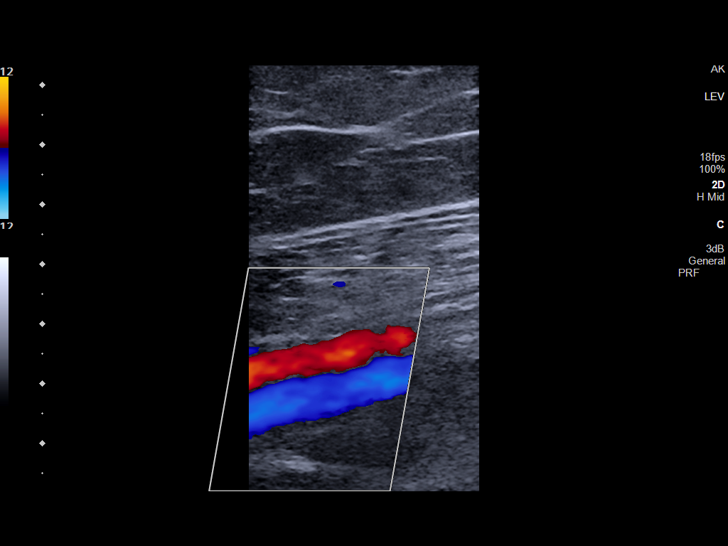
[im 68/83]
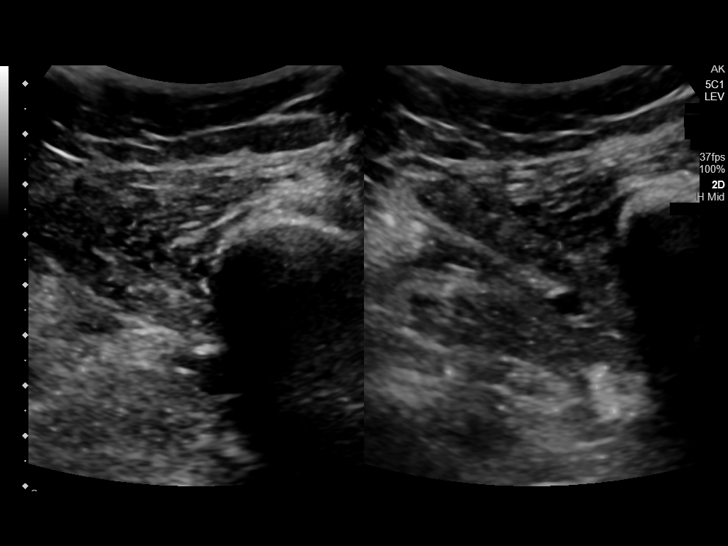
[im 75/83]
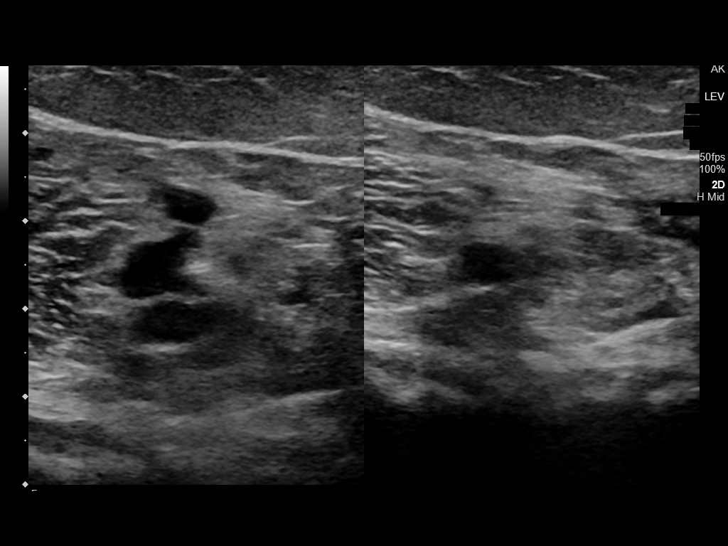
[im 83/83]
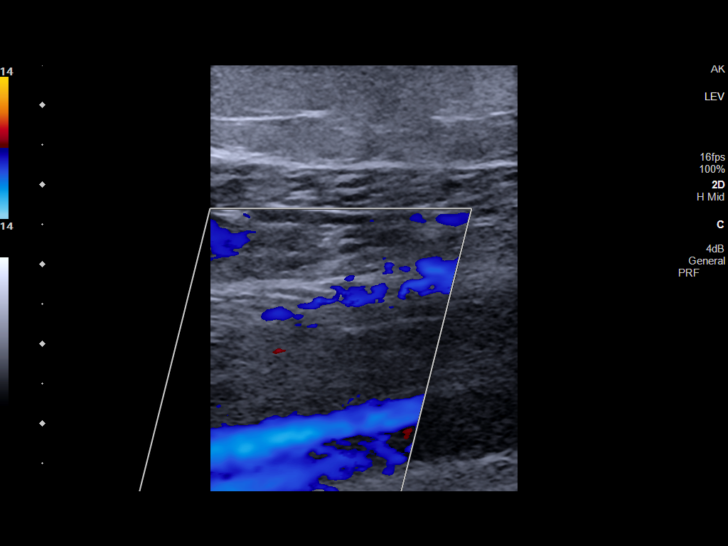

[13 of 24 positions shown; findings below may reference images not displayed]

FINDINGS: RIGHT LOWER EXTREMITY

Common Femoral Vein: No evidence of thrombus. Normal
compressibility, respiratory phasicity and response to augmentation.

Saphenofemoral Junction: No evidence of thrombus. Normal
compressibility and flow on color Doppler imaging.

Profunda Femoral Vein: No evidence of thrombus. Normal
compressibility and flow on color Doppler imaging.

Femoral Vein: No evidence of thrombus. Normal compressibility,
respiratory phasicity and response to augmentation.

Popliteal Vein: No evidence of thrombus. Normal compressibility,
respiratory phasicity and response to augmentation.

Calf Veins: No evidence of thrombus. Normal compressibility and flow
on color Doppler imaging.

LEFT LOWER EXTREMITY

Common Femoral Vein: No evidence of thrombus. Normal
compressibility, respiratory phasicity and response to augmentation.

Saphenofemoral Junction: No evidence of thrombus. Normal
compressibility and flow on color Doppler imaging.

Profunda Femoral Vein: No evidence of thrombus. Normal
compressibility and flow on color Doppler imaging.

Femoral Vein: No evidence of thrombus. Normal compressibility,
respiratory phasicity and response to augmentation.

Popliteal Vein: No evidence of thrombus. Normal compressibility,
respiratory phasicity and response to augmentation.

Calf Veins: No evidence of thrombus. Normal compressibility and flow
on color Doppler imaging.

Other Findings:  None.
IMPRESSION: No evidence of deep venous thrombosis in either lower extremity.

## 2021-08-10 MED ORDER — IOHEXOL 350 MG/ML SOLN
75.0000 mL | Freq: Once | INTRAVENOUS | Status: AC | PRN
  Administered 2021-08-10: 75 mL via INTRAVENOUS

## 2021-08-10 MED ORDER — ONDANSETRON HCL 4 MG/2ML IJ SOLN
4.0000 mg | Freq: Once | INTRAMUSCULAR | Status: AC
  Administered 2021-08-10: 4 mg via INTRAVENOUS

## 2021-08-10 MED ORDER — ONDANSETRON HCL 4 MG PO TABS: 4.0000 mg | ORAL_TABLET | Freq: Once | ORAL | Status: DC

## 2021-08-10 MED ORDER — KETOROLAC TROMETHAMINE 30 MG/ML IJ SOLN
30.0000 mg | Freq: Once | INTRAMUSCULAR | Status: AC
  Administered 2021-08-10: 30 mg via INTRAVENOUS
  Filled 2021-08-10: qty 1

## 2021-08-10 MED ORDER — SULFAMETHOXAZOLE-TRIMETHOPRIM 800-160 MG PO TABS: 1.0000 | ORAL_TABLET | Freq: Two times a day (BID) | ORAL | 0 refills | Status: AC

## 2021-08-10 MED ORDER — MORPHINE SULFATE (PF) 4 MG/ML IV SOLN
4.0000 mg | Freq: Once | INTRAVENOUS | Status: AC
  Administered 2021-08-10: 4 mg via INTRAVENOUS
  Filled 2021-08-10: qty 1

## 2021-08-10 NOTE — ED Provider Notes (Signed)
[facility] Emergency Department Provider Note  ____________________________________________   Event Date/Time   First MD Initiated Contact with Patient 08/10/21 1501     (approximate)  I have reviewed the triage vital signs and the nursing notes.   HISTORY  Chief Complaint Leg Pain    HPI Sandy Cruz is a 40 y.o. female patient reports both of her legs have been hurting.  She says her left leg began to swell and get red.  She has gained some weight and had increasing shortness of breath and tachycardia in the last day or 2.  He does not have any chest pain especially with deep breathing.  She is not hypoxic.  She has had a blood clot before she says.  Review of her old record shows that this happened in 2016.  Patient reports her knees feel like they want to give way when she walks.         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 2011   VAGINAL HYSTERECTOMY  06/17/2012    Prior to Admission medications   Medication Sig Start Date End Date Taking? Authorizing Provider  clonazePAM (KLONOPIN) 0.5 MG tablet Take 0.5 mg by mouth 2 (two) times daily as needed for  anxiety.    [provider]  cyclobenzaprine (FLEXERIL) 10 MG tablet Take 10 mg by mouth 3 (three) times daily as needed for muscle spasms.    [provider]  estradiol (VIVELLE-DOT) 0.075 MG/24HR Place 1 patch onto the skin 2 (two) times a week. 08/29/13   Reva Bores, MD  etodolac (LODINE) 200 MG capsule Take 1 capsule (200 mg total) by mouth every 8 (eight) hours. 04/17/17   Rebecka Apley, MD  etodolac (LODINE) 400 MG tablet Take 400 mg by mouth 2 (two) times daily.    [provider]  gabapentin (NEURONTIN) 300 MG capsule TAKE ONE CAPSULE BY MOUTH THREE TIMES DAILY 02/25/14   Reva Bores, MD  methocarbamol (ROBAXIN-750) 750 MG tablet Take 2 tablets (1,500 mg total) by mouth 4 (four) times daily. 05/03/16   Joni Reining, PA-C  methylPREDNISolone (MEDROL DOSEPAK) 4 MG TBPK tablet Take Tapered dose as directed 05/03/16   Joni Reining, PA-C  oxyCODONE-acetaminophen (PERCOCET) 5-325 MG per tablet Take 1 tablet by mouth every 4 (four) hours as needed. 08/20/15   Charlynne Pander, MD  oxyCODONE-acetaminophen (ROXICET) 5-325 MG tablet Take 1 tablet by mouth every 4 (four) hours as needed for severe pain. 11/29/16   Darci Current, MD  Rivaroxaban (XARELTO STARTER PACK) 15 & 20 MG TBPK  Take as directed on package: Start with one 15mg  tablet by mouth twice a day with food. On Day 22, switch to one 20mg  tablet once a day with food. 08/20/15   , MD  traMADol (ULTRAM) 50 MG tablet Take 1 tablet (50 mg total) by mouth every 6 (six) hours as needed. 04/17/17   Charlynne Pander, MD    Allergies Hydrocodone  Family History  Problem Relation Age of Onset   Ovarian cysts Mother    Cervical cancer Mother    Stomach cancer Maternal Grandmother    Lung cancer Maternal Grandfather    Heart disease Brother    Lung cancer Maternal Uncle     Social History Social History   Tobacco Use   Smoking status: Every Day    Packs/day: 1.00    Years: 12.00     Pack years: 12.00    Types: Cigarettes   Smokeless tobacco: Never  Substance Use Topics   Alcohol use: No   Drug use: No    Review of Systems Constitutional: No fever/chills Eyes: No visual changes. ENT: No sore throat. Cardiovascular: Denies chest pain. Respiratory: Denies shortness of breath. Gastrointestinal: No abdominal pain.  No nausea, no vomiting.  No diarrhea.  No constipation. Genitourinary: Negative for dysuria. Musculoskeletal: Negative for back pain. Skin: Negative for rash. Neurological: Negative for headaches, focal weakness   ____________________________________________   PHYSICAL EXAM:  VITAL SIGNS: ED Triage Vitals  Enc Vitals Group     BP 08/10/21 1324 (!) 151/94     Pulse Rate 08/10/21 1324 (!) 135     Resp 08/10/21 1324 (!) 22     Temp 08/10/21 1324 99.1 F (37.3 C)     Temp Source 08/10/21 1324 Oral     SpO2 08/10/21 1324 97 %     Weight --      Height 08/10/21 1325 5\' 3"  (1.6 m)     Head Circumference --      Peak Flow --      Pain Score 08/10/21 1325 9     Pain Loc --      Pain Edu? --      Excl. in GC? --    Constitutional: Alert and oriented.  Anxious Eyes: Conjunctivae are normal.  Head: Atraumatic. Nose: No congestion/rhinnorhea. Mouth/Throat: Mucous membranes are moist.  Oropharynx non-erythematous. Neck: No stridor. Cardiovascular: Rapid rate, regular rhythm. Grossly normal heart sounds.  Good peripheral circulation.  Patient's heart rate was up to a high of 135.  Currently 208 while she is sitting and resting. Respiratory: Normal respiratory effort.  No retractions. Lungs CTAB. Gastrointestinal: Soft and nontender. No distention. No abdominal bruits.  Musculoskeletal: Left leg is somewhat swollen reddish and warmer than the right.  There is trace edema bilaterally. Neurologic:  Normal speech and language. No gross focal neurologic deficits are appreciated.  Skin:  Skin is warm, dry and intact. No rash noted. Psychiatric: Mood and  affect are normal. Speech and behavior are normal.  ____________________________________________   LABS (all labs ordered are listed, but only abnormal results are displayed)  Labs Reviewed  BASIC METABOLIC PANEL - Abnormal; Notable for the following components:      Result Value   Glucose, Bld 109 (*)    All other components within normal limits  CBC  D-DIMER, QUANTITATIVE   ____________________________________________  EKG EKG read interpreted by me shows sinus tachycardia rate of 126 normal axis no acute ST-T wave changes there is some diffuse ST-T partial flattening  likely rate related  ____________________________________________  RADIOLOGY Jill Poling, personally viewed and evaluated these images (plain radiographs) as part of my medical decision making, as well as reviewing the written report by the radiologist.  ED MD interpretation: Chest x-ray read by radiology reviewed by me shows no acute changes Official radiology report(s): DG Chest 2 View  Result Date: 08/10/2021 CLINICAL DATA:  Shortness of breath. Bilateral leg pain for a few days. Left leg erythema. History of DVT. EXAM: CHEST - 2 VIEW COMPARISON:  Radiographs 02/23/2009. More recent study 03/18/2017 unavailable. FINDINGS: The heart size and mediastinal contours are stable. There is probable linear scarring within the right middle lobe and lingula. No edema, confluent airspace opacity, pleural effusion or pneumothorax. The bones appear unremarkable. IMPRESSION: No evidence of active cardiopulmonary process. Mild scarring in the right middle lobe and lingula. Electronically Signed   By: Carey Bullocks M.D.   On: 08/10/2021 14:15    ____________________________________________   PROCEDURES  Procedure(s) performed (including Critical Care):  Procedures   ____________________________________________   INITIAL IMPRESSION / ASSESSMENT AND PLAN / ED COURSE  The venous ultrasound and D-dimer are  pending.  I am signing the patient out to oncoming provider.            ____________________________________________   FINAL CLINICAL IMPRESSION(S) / ED DIAGNOSES  Final diagnoses:  Left leg pain     ED Discharge Orders     None        Note:  This document was prepared using Dragon voice recognition software and may include unintentional dictation errors.    Arnaldo Natal, MD 08/10/21 [phone]

## 2021-08-10 NOTE — Discharge Instructions (Addendum)
Please seek medical attention for any high fevers, chest pain, shortness of breath, change in behavior, persistent vomiting, bloody stool or any other new or concerning symptoms.  

## 2021-08-10 NOTE — ED Triage Notes (Signed)
Pt to ED for bilateral leg pain that started a few days ago. States swelling and redness to left leg. +shob and weight gain Hx DVT

## 2021-08-10 NOTE — ED Provider Notes (Signed)
Ultrasound without DVT.  D-dimer was elevated.  Did get a CT angio PE given elevated D-dimer and shortness of breath.  This did not show any PE or other acute concerning abnormality.  At this time given that the leg is somewhat swollen and red I do wonder if patient is suffering from cellulitis.  I discussed this with the patient.  Will plan on discharging home with antibiotics.  Encouraged primary care follow-up.   Phineas Semen, MD 08/10/21 Windy Fast

## 2021-10-29 ENCOUNTER — Other Ambulatory Visit: Payer: Self-pay

## 2021-10-29 ENCOUNTER — Encounter: Payer: Self-pay | Admitting: Emergency Medicine

## 2021-10-29 ENCOUNTER — Emergency Department: Payer: Medicaid Other

## 2021-10-29 DIAGNOSIS — R0789 Other chest pain: Secondary | ICD-10-CM | POA: Diagnosis not present

## 2021-10-29 DIAGNOSIS — F17210 Nicotine dependence, cigarettes, uncomplicated: Secondary | ICD-10-CM | POA: Insufficient documentation

## 2021-10-29 DIAGNOSIS — Z7901 Long term (current) use of anticoagulants: Secondary | ICD-10-CM | POA: Insufficient documentation

## 2021-10-29 DIAGNOSIS — K219 Gastro-esophageal reflux disease without esophagitis: Secondary | ICD-10-CM | POA: Insufficient documentation

## 2021-10-29 DIAGNOSIS — R202 Paresthesia of skin: Secondary | ICD-10-CM | POA: Insufficient documentation

## 2021-10-29 DIAGNOSIS — R079 Chest pain, unspecified: Secondary | ICD-10-CM | POA: Diagnosis present

## 2021-10-29 LAB — HEPATIC FUNCTION PANEL
ALT: 24 U/L (ref 0–44)
AST: 18 U/L (ref 15–41)
Albumin: 4.2 g/dL (ref 3.5–5.0)
Alkaline Phosphatase: 61 U/L (ref 38–126)
Bilirubin, Direct: <0.1 mg/dL (ref 0.0–0.2)
Total Bilirubin: 0.6 mg/dL (ref 0.3–1.2)
Total Protein: 7.1 g/dL (ref 6.5–8.1)

## 2021-10-29 LAB — CBC
HCT: 44.3 % (ref 36.0–46.0)
Hemoglobin: 15.3 g/dL — ABNORMAL HIGH (ref 12.0–15.0)
MCH: 29.8 pg (ref 26.0–34.0)
MCHC: 34.5 g/dL (ref 30.0–36.0)
MCV: 86.2 fL (ref 80.0–100.0)
Platelets: 325 10*3/uL (ref 150–400)
RBC: 5.14 MIL/uL — ABNORMAL HIGH (ref 3.87–5.11)
RDW: 14 % (ref 11.5–15.5)
WBC: 10.1 10*3/uL (ref 4.0–10.5)
nRBC: 0 % (ref 0.0–0.2)

## 2021-10-29 LAB — BASIC METABOLIC PANEL
Anion gap: 9 (ref 5–15)
BUN: 12 mg/dL (ref 6–20)
CO2: 22 mmol/L (ref 22–32)
Calcium: 9 mg/dL (ref 8.9–10.3)
Chloride: 105 mmol/L (ref 98–111)
Creatinine, Ser: 0.8 mg/dL (ref 0.44–1.00)
GFR, Estimated: >60 mL/min (ref >60)
Glucose, Bld: 96 mg/dL (ref 70–99)
Potassium: 3.7 mmol/L (ref 3.5–5.1)
Sodium: 136 mmol/L (ref 135–145)

## 2021-10-29 LAB — TROPONIN I (HIGH SENSITIVITY): Troponin I (High Sensitivity): 2 ng/L (ref <18)

## 2021-10-29 LAB — LIPASE, BLOOD: Lipase: 22 U/L (ref 11–51)

## 2021-10-29 IMAGING — CR DG CHEST 2V
2 series · 2 of 2 positions shown · non-contrast
Comparison: Chest CT and PA and lateral chest both 08/10/2021

CLINICAL DATA: Epigastric and chest pain.

EXAM:
CHEST - 2 VIEW

[chest pa]
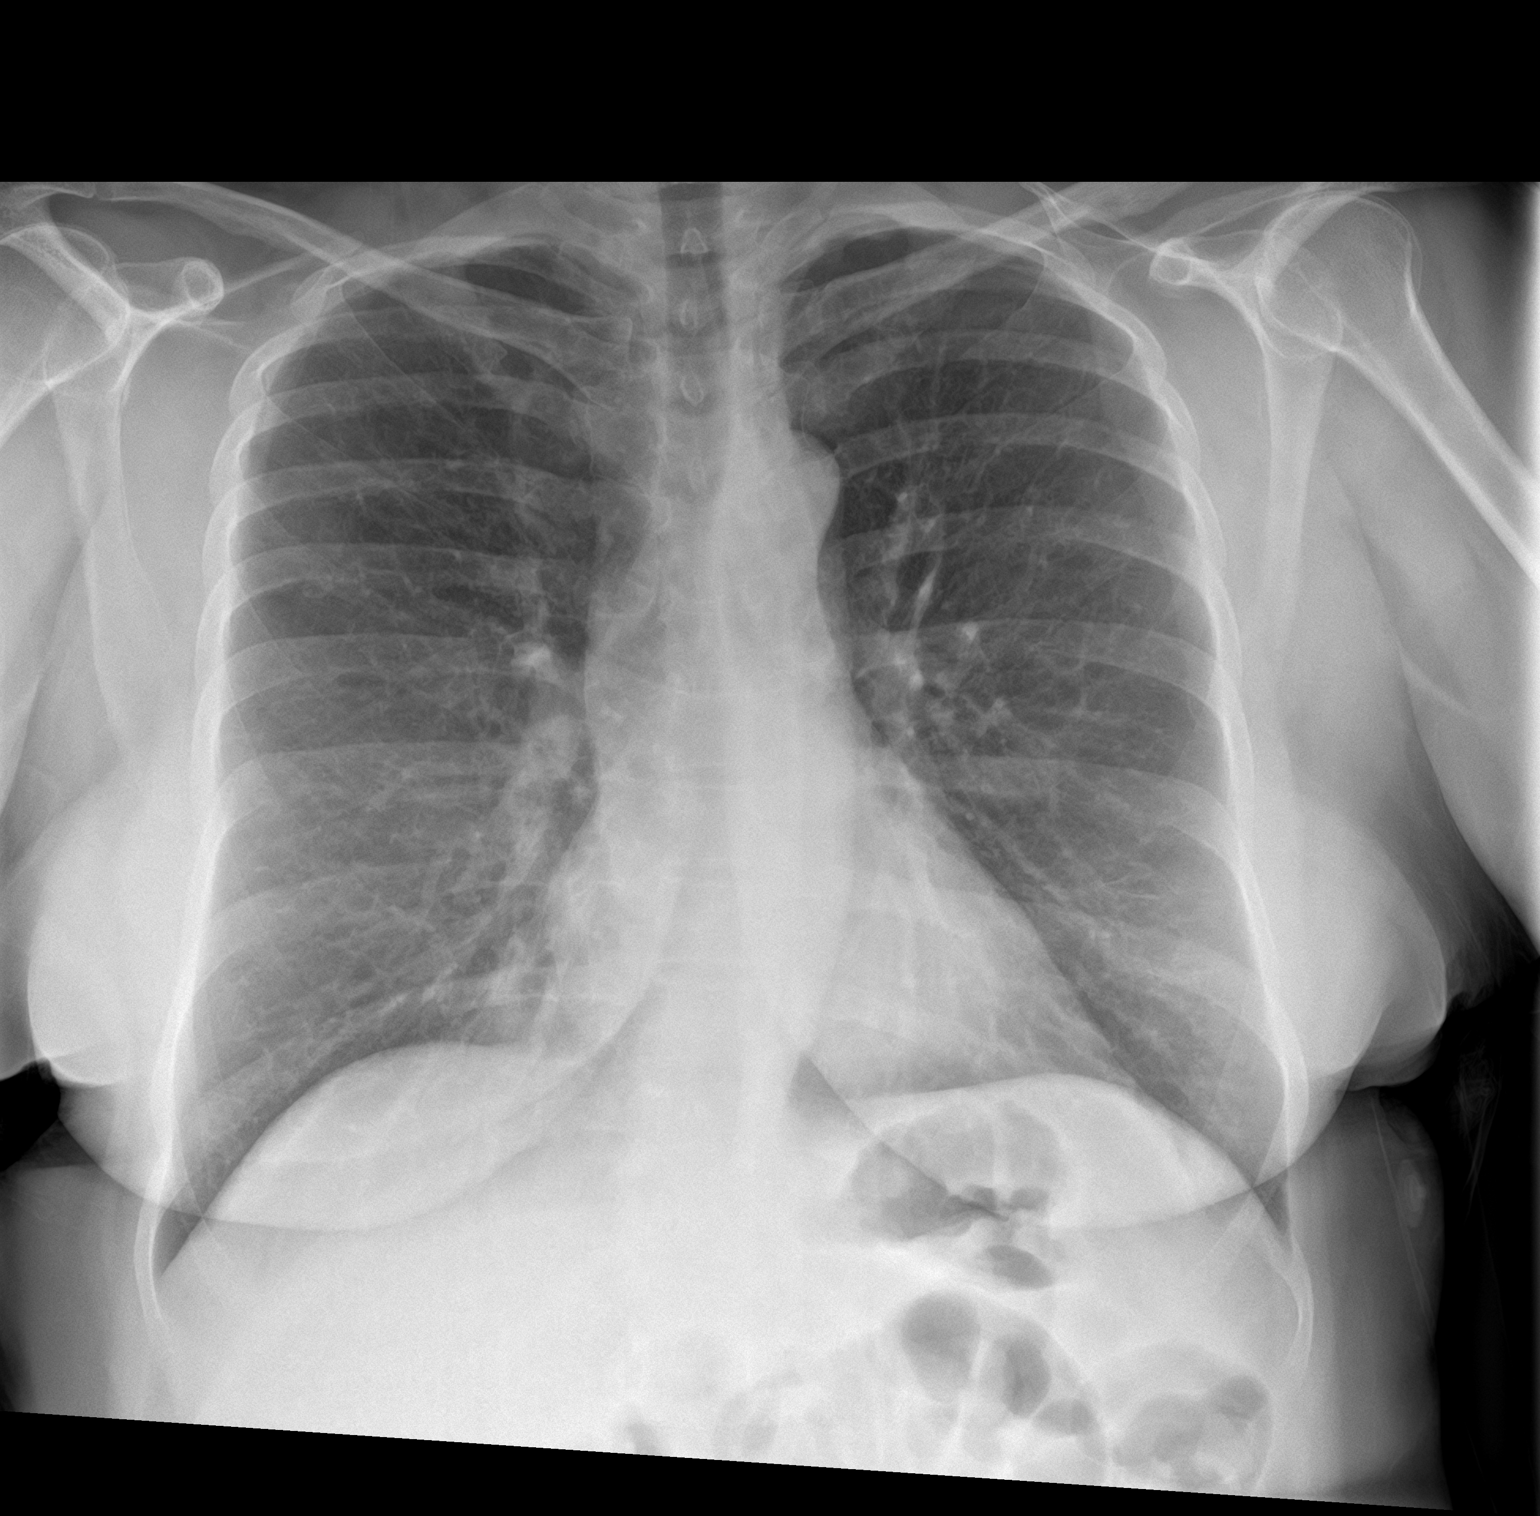

[chest lat]
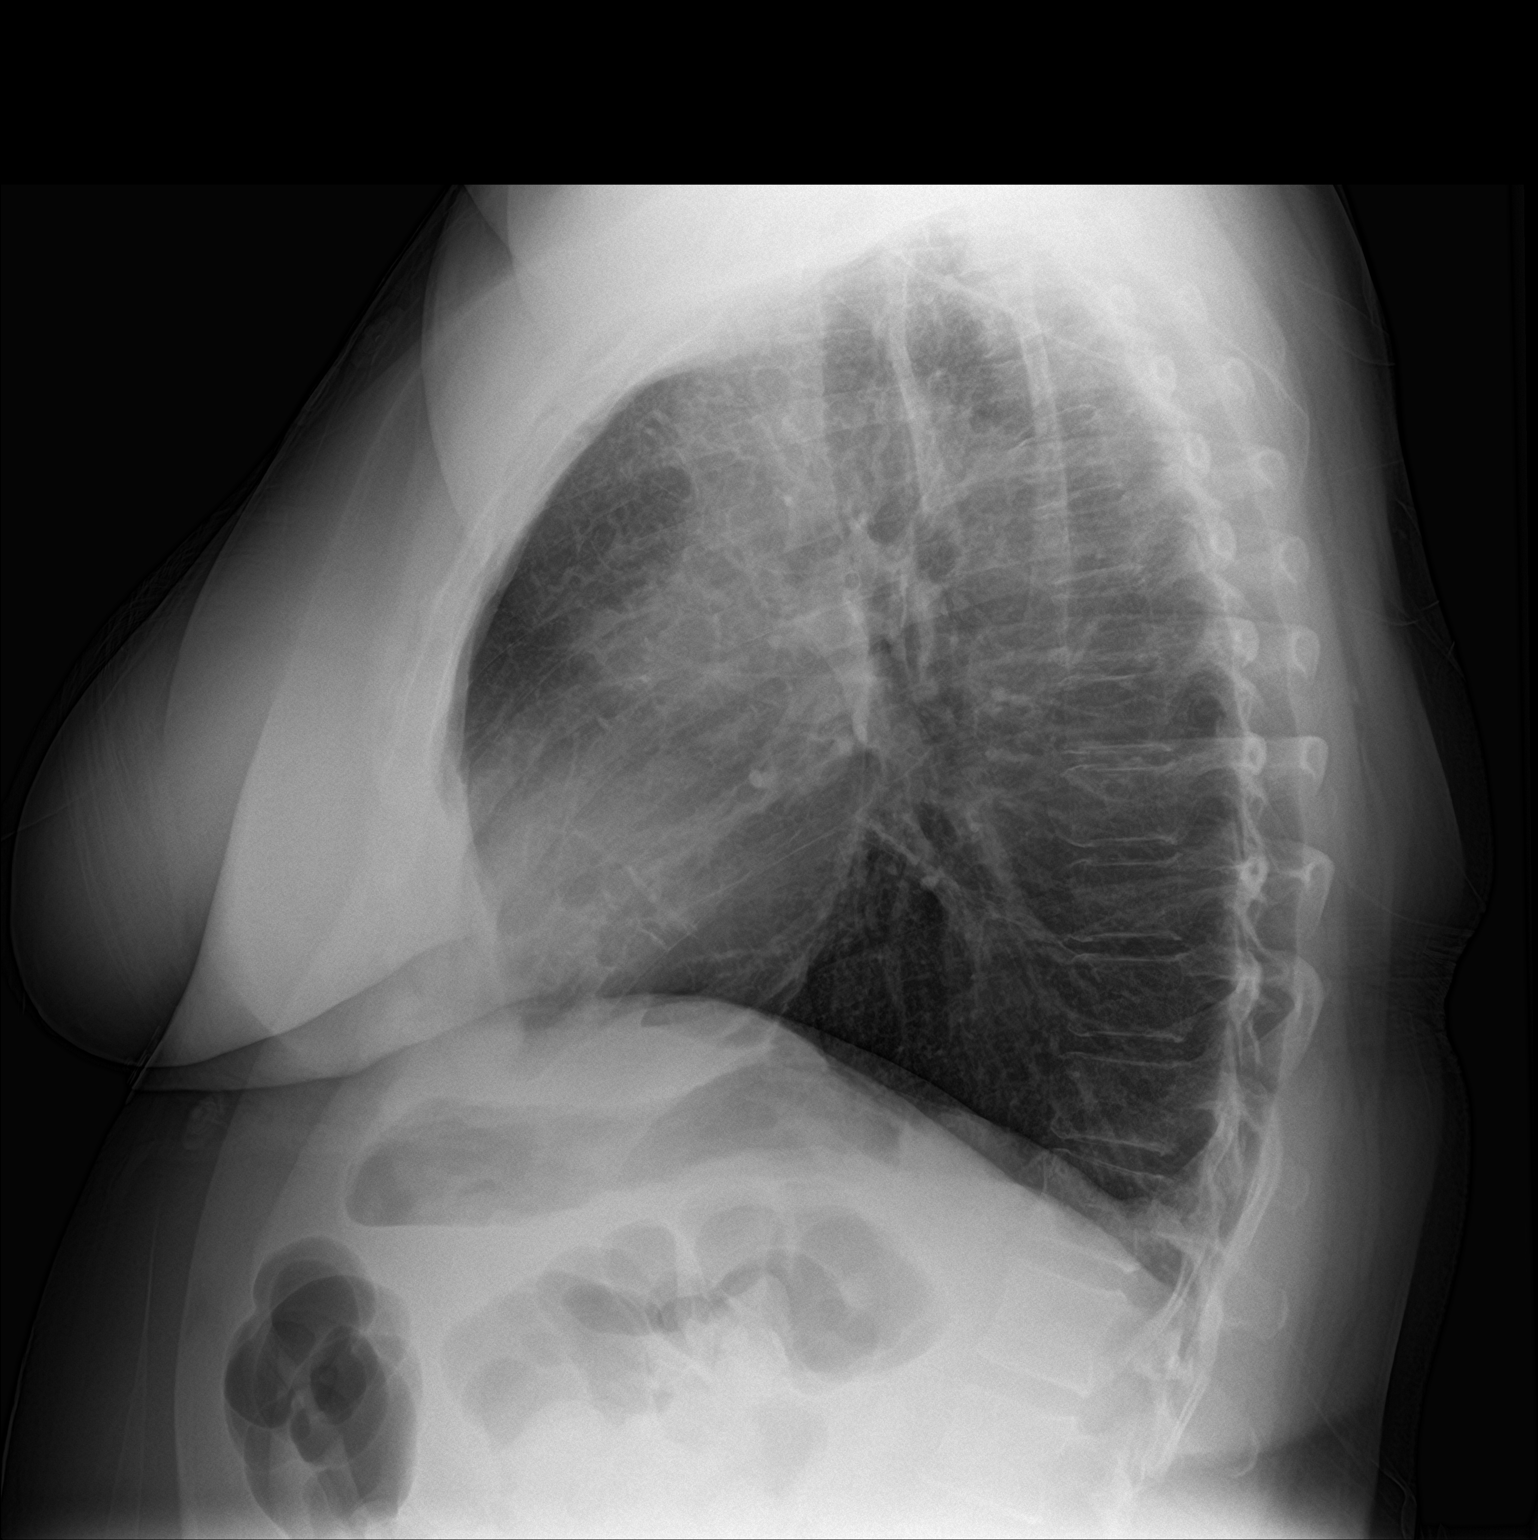

[2 of 2 positions shown; findings below may reference images not displayed]

FINDINGS: The lungs are slightly emphysematous but clear. Heart size and
vasculature are normal. The mediastinum is normally configured. The
sulci are sharp. Mild thoracic spondylosis.
IMPRESSION: No active cardiopulmonary disease.  Stable COPD chest.

## 2021-10-29 NOTE — ED Triage Notes (Signed)
Pt in via [facility] with burning epigastric pain that began 1hr PTA. Pain has since radiated up to her central chest, and between shoulders. Feels clammy, denies any n/v or sob. VSS in triage

## 2021-10-30 ENCOUNTER — Emergency Department
Admission: EM | Admit: 2021-10-30 | Discharge: 2021-10-30 | Disposition: A | Discharge status: Home / Self Care | Payer: Medicaid Other | Attending: Emergency Medicine | Admitting: Emergency Medicine

## 2021-10-30 ENCOUNTER — Emergency Department: Payer: Medicaid Other

## 2021-10-30 DIAGNOSIS — R1013 Epigastric pain: Secondary | ICD-10-CM

## 2021-10-30 DIAGNOSIS — K219 Gastro-esophageal reflux disease without esophagitis: Secondary | ICD-10-CM

## 2021-10-30 LAB — TROPONIN I (HIGH SENSITIVITY): Troponin I (High Sensitivity): <2 ng/L (ref <18)

## 2021-10-30 IMAGING — CT CT ANGIO CHEST-ABD-PELV FOR DISSECTION W/ AND WO/W CM
2 of 7 series · 13 of 46 positions shown, 15 images · IV contrast (APPLIED)
Comparison: None.

CLINICAL DATA: Epigastric abdominal pain

EXAM:
CT ANGIOGRAPHY CHEST, ABDOMEN AND PELVIS
TECHNIQUE: Non-contrast CT of the chest was initially obtained.

[Series 5: axial arterial · axial · arterial · 0.83mm/px · z∈[-973,-421]mm · 10 of 214 slices shown, 12 images]
[im 15/214  soft-tissue]
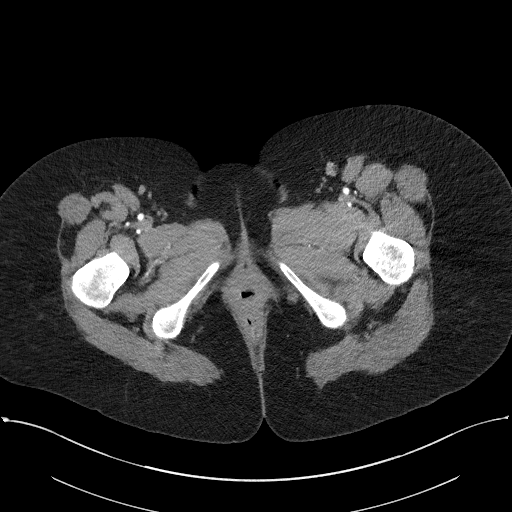
[im 15/214  bone]
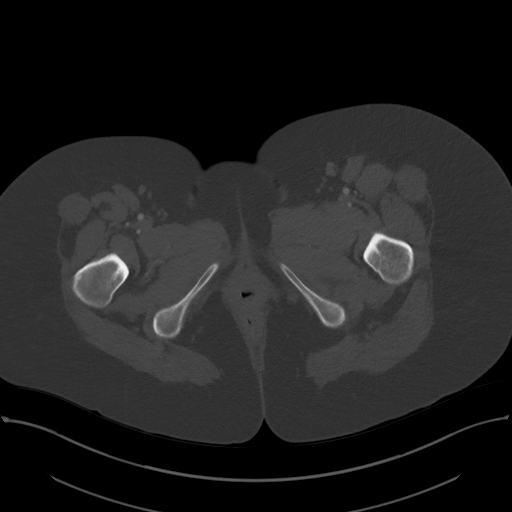
[im 43/214  soft-tissue]
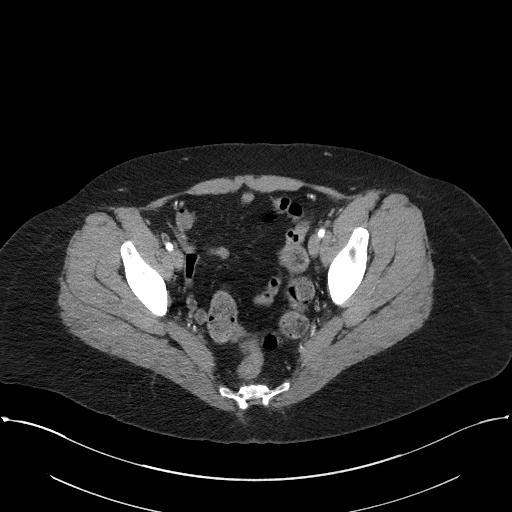
[im 57/214  soft-tissue]
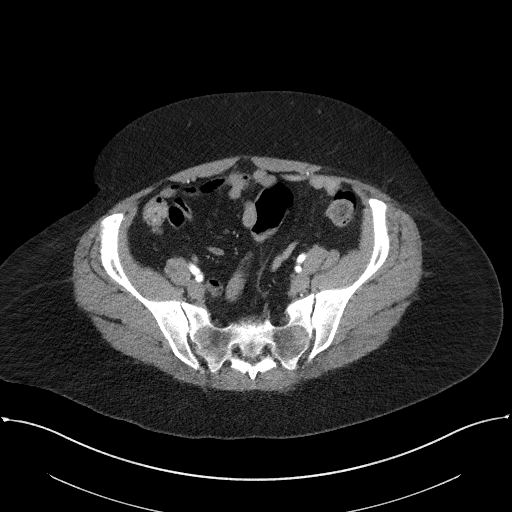
[im 72/214  soft-tissue]
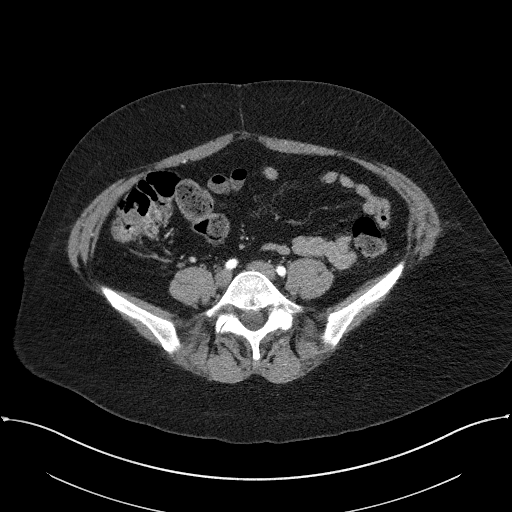
[im 100/214  soft-tissue]
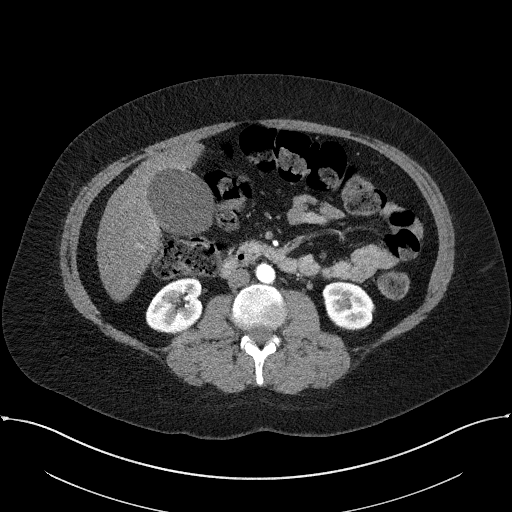
[im 114/214  soft-tissue]
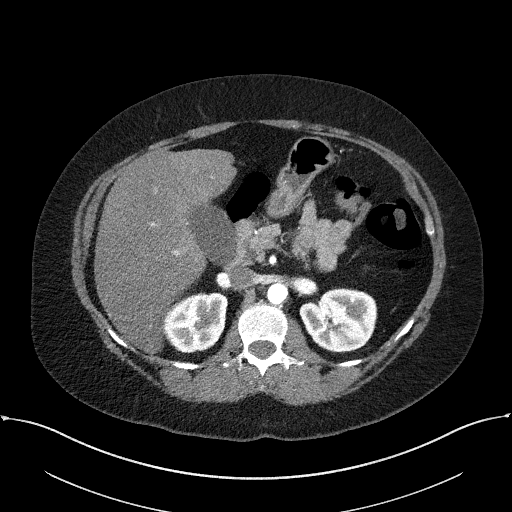
[im 143/214  soft-tissue]
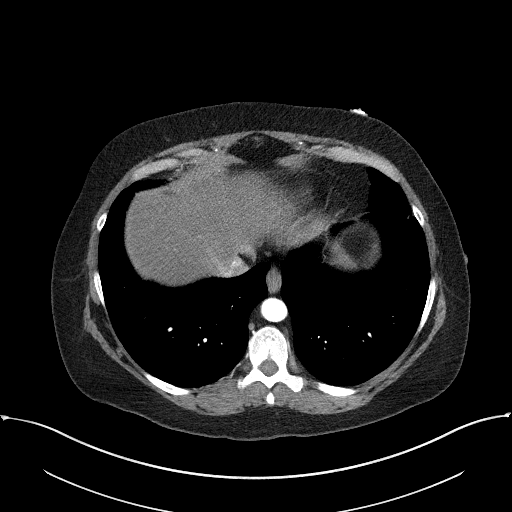
[im 157/214  soft-tissue]
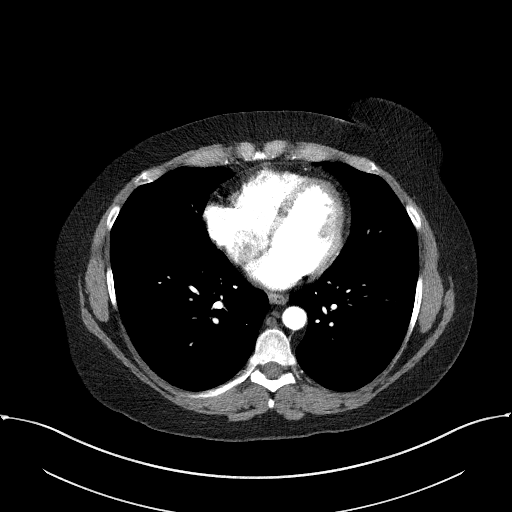
[im 171/214  soft-tissue]
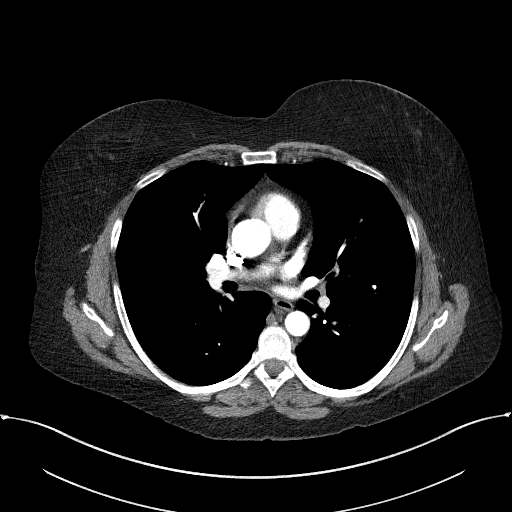
[im 171/214  bone]
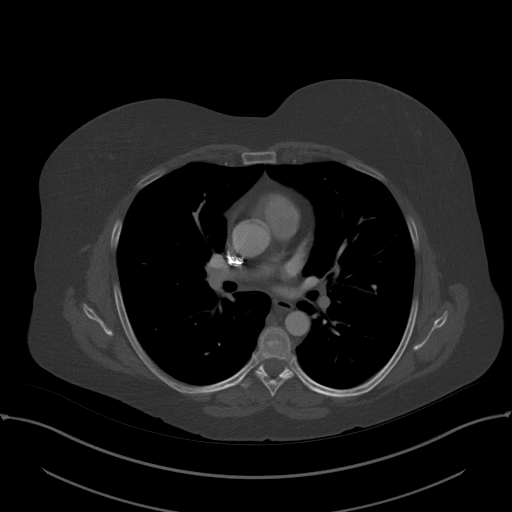
[im 199/214  soft-tissue]
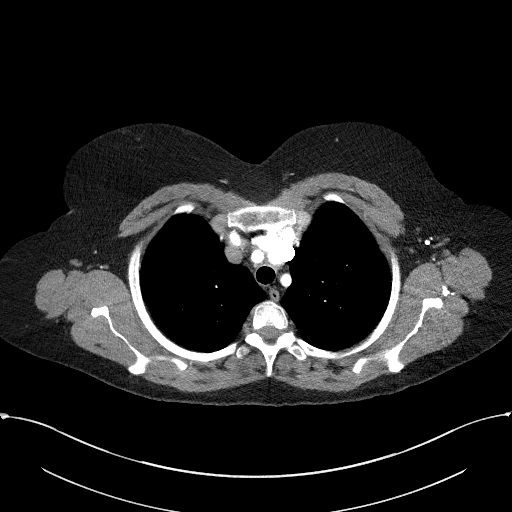

[Series 8: coronals · coronal · 0.78mm/px · 3 of 156 slices shown]
[im 39/156  soft-tissue]
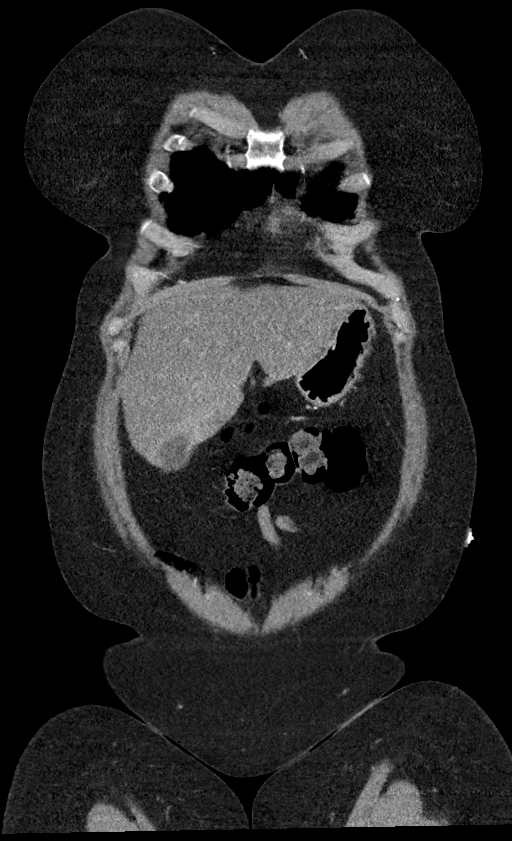
[im 78/156  soft-tissue]
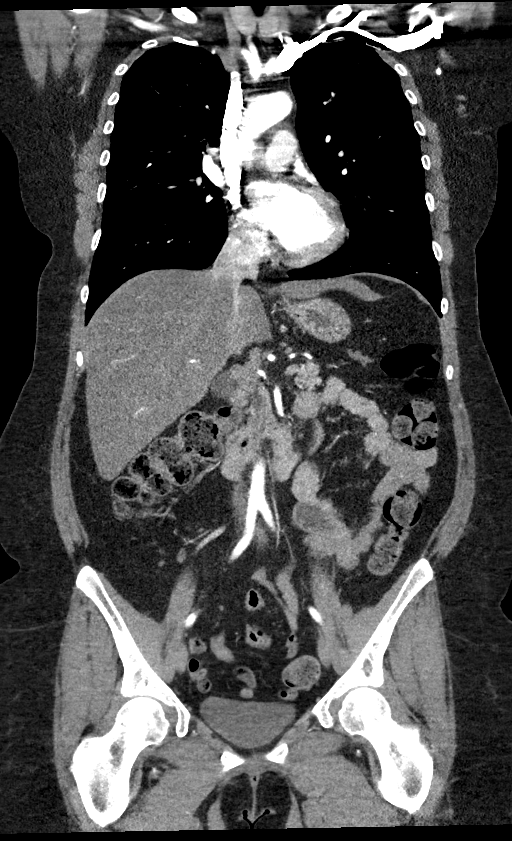
[im 117/156  soft-tissue]
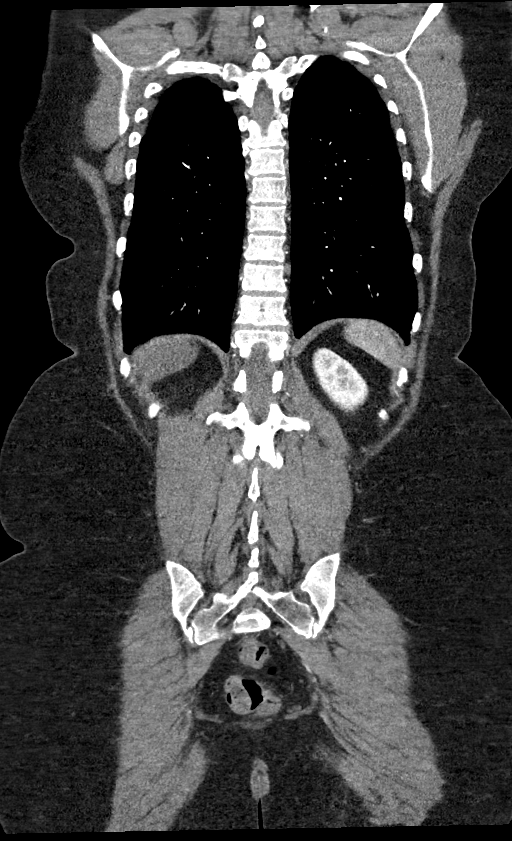

[13 of 46 positions shown; findings below may reference images not displayed]

Multidetector CT imaging through the chest, abdomen and pelvis was
performed using the standard protocol during bolus administration of
intravenous contrast. Multiplanar reconstructed images and MIPs were
obtained and reviewed to evaluate the vascular anatomy.

CONTRAST:  100mL OMNIPAQUE IOHEXOL 350 MG/ML SOLN
FINDINGS: CTA CHEST FINDINGS

Cardiovascular:

--Heart: The heart size is normal.  There is nopericardial effusion.

--Aorta: The course and caliber of the thoracic aorta are normal.
There is no aortic atherosclerotic calcification. Precontrast images
show no aortic intramural hematoma. There is no blood pool,
dissection or penetrating ulcer demonstrated on arterial phase
postcontrast imaging. There is a conventional 3 vessel aortic arch
branching pattern. The proximal arch vessels are widely patent.

--Pulmonary Arteries: Contrast timing is optimized for preferential
opacification of the aorta. Within that limitation, normal central
pulmonary arteries.

Mediastinum/Nodes: No mediastinal, hilar or axillary
lymphadenopathy. The visualized thyroid and thoracic esophageal
course are unremarkable.

Lungs/Pleura: No pulmonary nodules or masses. No pleural effusion or
pneumothorax. No focal airspace consolidation. No focal pleural
abnormality.

Musculoskeletal: No chest wall abnormality. No acute osseous
findings.

Review of the MIP images confirms the above findings.

CTA ABDOMEN AND PELVIS FINDINGS

VASCULAR

Aorta: Normal caliber aorta without aneurysm, dissection, vasculitis
or hemodynamically significant stenosis. There is no aortic
atherosclerosis.

Celiac: No aneurysm, dissection or hemodynamically significant
stenosis. There is a replaced right hepatic artery.

SMA: Widely patent without dissection or stenosis.

Renals: Single renal arteries bilaterally. No aneurysm, dissection,
stenosis or evidence of fibromuscular dysplasia.

IMA: Patent without abnormality.

Inflow: No aneurysm, stenosis or dissection.

Veins: Normal course and caliber of the major veins. Assessment is
otherwise limited by the arterial dominant contrast phase.

Review of the MIP images confirms the above findings.

NON-VASCULAR

Hepatobiliary: Normal hepatic contours and density. No visible
biliary dilatation. Normal gallbladder.

Pancreas: Normal contours without ductal dilatation. No
peripancreatic fluid collection.

Spleen: Normal arterial phase splenic enhancement pattern.

Adrenals/Urinary Tract:

--Adrenal glands: Normal.

--Right kidney/ureter: No hydronephrosis or perinephric stranding.
No nephrolithiasis. No obstructing ureteral stones.

--Left kidney/ureter: No hydronephrosis or perinephric stranding. No
nephrolithiasis. No obstructing ureteral stones.

--Urinary bladder: Unremarkable.

Stomach/Bowel:

--Stomach/Duodenum: No hiatal hernia or other gastric abnormality.
Normal duodenal course and caliber.

--Small bowel: No dilatation or inflammation.

--Colon: No focal abnormality.

--Appendix: Normal.

Lymphatic:  No abdominal or pelvic lymphadenopathy.

Reproductive: Status post hysterectomy. No adnexal mass.

Musculoskeletal. No bony spinal canal stenosis or focal osseous
abnormality.

Other: None.

Review of the MIP images confirms the above findings.
IMPRESSION: No acute aortic syndrome or other acute abnormality of the chest,
abdomen or pelvis.

## 2021-10-30 MED ORDER — IOHEXOL 350 MG/ML SOLN
100.0000 mL | Freq: Once | INTRAVENOUS | Status: AC | PRN
  Administered 2021-10-30: 100 mL via INTRAVENOUS

## 2021-10-30 MED ORDER — PANTOPRAZOLE SODIUM 40 MG PO TBEC: 40.0000 mg | DELAYED_RELEASE_TABLET | Freq: Every day | ORAL | 1 refills | Status: AC

## 2021-10-30 NOTE — ED Notes (Signed)
Pt given applesauce and ginger ale 

## 2021-10-30 NOTE — ED Provider Notes (Signed)
[facility] Emergency Department Provider Note  ____________________________________________  Time seen: Approximately 1:27 AM  I have reviewed the triage vital signs and the nursing notes.   HISTORY  Chief Complaint Chest Pain and Abdominal Pain   HPI Sandy Cruz is a 40 y.o. female with a history of DVT on Xarelto, endometriosis, ovarian cyst, anxiety who presents for evaluation of abdominal pain.  Patient reports that her pain started an hour prior to arrival.  Pain started after patient ate. The pain was severe, located in the epigastric region, initially sharp and radiating up into her chest.  She then started feeling burning sensation between her shoulder blades in the back.  She reports that she felt very clammy.  She denies nausea, vomiting, diarrhea, shortness of breath, cough, fever or chills.  She denies ever having similar pain.  She reports that her symptoms have now fully resolved and she has no further episodes of pain.    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 2011   VAGINAL HYSTERECTOMY  06/17/2012    Prior to Admission medications   Medication  Sig Start Date End Date Taking? Authorizing Provider  pantoprazole (PROTONIX) 40 MG tablet Take 1 tablet (40 mg total) by mouth daily. 10/30/21 10/30/22 Yes Jaxzen Vanhorn, [place], MD  clonazePAM (KLONOPIN) 0.5 MG tablet Take 0.5 mg by mouth 2 (two) times daily as needed for anxiety.    [provider]  cyclobenzaprine (FLEXERIL) 10 MG tablet Take 10 mg by mouth 3 (three) times daily as needed for muscle spasms.    [provider]  estradiol (VIVELLE-DOT) 0.075 MG/24HR Place 1 patch onto the skin 2 (two) times a week. 08/29/13   Reva Bores, MD  etodolac (LODINE) 200 MG capsule Take 1 capsule (200 mg total) by mouth every 8 (eight) hours. 04/17/17   Rebecka Apley, MD  etodolac (LODINE) 400 MG tablet Take 400 mg by mouth 2 (two) times daily.    [provider]  gabapentin (NEURONTIN) 300 MG capsule TAKE ONE CAPSULE BY MOUTH THREE TIMES DAILY 02/25/14   Reva Bores, MD  methocarbamol (ROBAXIN-750) 750 MG tablet Take 2 tablets (1,500 mg total) by mouth 4 (four) times daily. 05/03/16   Joni Reining, PA-C  methylPREDNISolone (MEDROL DOSEPAK) 4 MG TBPK tablet Take Tapered dose as directed 05/03/16   Joni Reining, PA-C  oxyCODONE-acetaminophen (PERCOCET) 5-325 MG per tablet Take 1 tablet by mouth every 4 (four) hours as needed. 08/20/15   Charlynne Pander, MD  oxyCODONE-acetaminophen (ROXICET) 5-325 MG tablet Take 1 tablet by mouth every 4 (four)  hours as needed for severe pain. 11/29/16   Darci Current, MD  Rivaroxaban (XARELTO STARTER PACK) 15 & 20 MG TBPK Take as directed on package: Start with one 15mg  tablet by mouth twice a day with food. On Day 22, switch to one 20mg  tablet once a day with food. 08/20/15   , MD  traMADol (ULTRAM) 50 MG tablet Take 1 tablet (50 mg total) by mouth every 6 (six) hours as needed. 04/17/17   Charlynne Pander, MD    Allergies Hydrocodone  Family History  Problem Relation Age of Onset   Ovarian cysts Mother     Cervical cancer Mother    Stomach cancer Maternal Grandmother    Lung cancer Maternal Grandfather    Heart disease Brother    Lung cancer Maternal Uncle     Social History Social History   Tobacco Use   Smoking status: Every Day    Packs/day: 0.50    Years: 12.00    Pack years: 6.00    Types: Cigarettes   Smokeless tobacco: Never  Substance Use Topics   Alcohol use: No   Drug use: No    Review of Systems  Constitutional: Negative for fever. Eyes: Negative for visual changes. ENT: Negative for sore throat. Neck: No neck pain  Cardiovascular: + chest pain. Respiratory: Negative for shortness of breath. Gastrointestinal: + abdominal pain. No vomiting or diarrhea. Genitourinary: Negative for dysuria. Musculoskeletal: + upper back pain. Skin: Negative for rash. Neurological: Negative for headaches, weakness or numbness. Psych: No SI or HI  ____________________________________________   PHYSICAL EXAM:  VITAL SIGNS: ED Triage Vitals  Enc Vitals Group     BP 10/29/21 2032 114/86     Pulse Rate 10/29/21 2032 100     Resp 10/29/21 2032 18     Temp 10/29/21 2032 97.9 F (36.6 C)     Temp Source 10/29/21 2032 Oral     SpO2 10/29/21 2032 96 %     Weight 10/29/21 2028 200 lb (90.7 kg)     Height --      Head Circumference --      Peak Flow --      Pain Score --      Pain Loc --      Pain Edu? --      Excl. in GC? --     Constitutional: Alert and oriented. Well appearing and in no apparent distress. HEENT:      Head: Normocephalic and atraumatic.         Eyes: Conjunctivae are normal. Sclera is non-icteric.       Mouth/Throat: Mucous membranes are moist.       Neck: Supple with no signs of meningismus. Cardiovascular: Regular rate and rhythm. No murmurs, gallops, or rubs. 2+ symmetrical distal pulses are present in all extremities. No JVD. Respiratory: Normal respiratory effort. Lungs are clear to auscultation bilaterally.  Gastrointestinal: Soft, non tender,  and non distended with positive bowel sounds. No rebound or guarding. Genitourinary: No CVA tenderness. Musculoskeletal:  No edema, cyanosis, or erythema of extremities. Neurologic: Normal speech and language. Face is symmetric. Moving all extremities. No gross focal neurologic deficits are appreciated. Skin: Skin is warm, dry and intact. No rash noted. Psychiatric: Mood and affect are normal. Speech and behavior are normal.  ____________________________________________   LABS (all labs ordered are listed, but only abnormal results are displayed)  Labs Reviewed  CBC - Abnormal; Notable for the following components:  Result Value   RBC 5.14 (*)    Hemoglobin 15.3 (*)    All other components within normal limits  BASIC METABOLIC PANEL  LIPASE, BLOOD  HEPATIC FUNCTION PANEL  TROPONIN I (HIGH SENSITIVITY)  TROPONIN I (HIGH SENSITIVITY)   ____________________________________________  EKG  ED ECG REPORT I, Nita Sickle, the attending physician, personally viewed and interpreted this ECG.  Sinus rhythm with a rate of 89, normal intervals, normal axis, T wave inversions in lead V3 with no ST elevations. ____________________________________________  RADIOLOGY  I have personally reviewed the images performed during this visit and I agree with the Radiologist's read.   Interpretation by Radiologist:  DG Chest 2 View  Result Date: 10/29/2021 CLINICAL DATA:  Epigastric and chest pain. EXAM: CHEST - 2 VIEW COMPARISON:  Chest CT and PA and lateral chest both 08/10/2021 FINDINGS: The lungs are slightly emphysematous but clear. Heart size and vasculature are normal. The mediastinum is normally configured. The sulci are sharp. Mild thoracic spondylosis. IMPRESSION: No active cardiopulmonary disease.  Stable COPD chest. Electronically Signed   By: Almira Bar M.D.   On: 10/29/2021 20:50   CT Angio Chest/Abd/Pel for Dissection W and/or Wo Contrast  Result Date:  10/30/2021 CLINICAL DATA:  Epigastric abdominal pain EXAM: CT ANGIOGRAPHY CHEST, ABDOMEN AND PELVIS TECHNIQUE: Non-contrast CT of the chest was initially obtained. Multidetector CT imaging through the chest, abdomen and pelvis was performed using the standard protocol during bolus administration of intravenous contrast. Multiplanar reconstructed images and MIPs were obtained and reviewed to evaluate the vascular anatomy. CONTRAST:  OMNIPAQUE IOHEXOL 350 MG/ML SOLN COMPARISON:  None. FINDINGS: CTA CHEST FINDINGS Cardiovascular: --Heart: The heart size is normal.  There is nopericardial effusion. --Aorta: The course and caliber of the thoracic aorta are normal. There is no aortic atherosclerotic calcification. Precontrast images show no aortic intramural hematoma. There is no blood pool, dissection or penetrating ulcer demonstrated on arterial phase postcontrast imaging. There is a conventional 3 vessel aortic arch branching pattern. The proximal arch vessels are widely patent. --Pulmonary Arteries: Contrast timing is optimized for preferential opacification of the aorta. Within that limitation, normal central pulmonary arteries. Mediastinum/Nodes: No mediastinal, hilar or axillary lymphadenopathy. The visualized thyroid and thoracic esophageal course are unremarkable. Lungs/Pleura: No pulmonary nodules or masses. No pleural effusion or pneumothorax. No focal airspace consolidation. No focal pleural abnormality. Musculoskeletal: No chest wall abnormality. No acute osseous findings. Review of the MIP images confirms the above findings. CTA ABDOMEN AND PELVIS FINDINGS VASCULAR Aorta: Normal caliber aorta without aneurysm, dissection, vasculitis or hemodynamically significant stenosis. There is no aortic atherosclerosis. Celiac: No aneurysm, dissection or hemodynamically significant stenosis. There is a replaced right hepatic artery. SMA: Widely patent without dissection or stenosis. Renals: Single renal arteries  bilaterally. No aneurysm, dissection, stenosis or evidence of fibromuscular dysplasia. IMA: Patent without abnormality. Inflow: No aneurysm, stenosis or dissection. Veins: Normal course and caliber of the major veins. Assessment is otherwise limited by the arterial dominant contrast phase. Review of the MIP images confirms the above findings. NON-VASCULAR Hepatobiliary: Normal hepatic contours and density. No visible biliary dilatation. Normal gallbladder. Pancreas: Normal contours without ductal dilatation. No peripancreatic fluid collection. Spleen: Normal arterial phase splenic enhancement pattern. Adrenals/Urinary Tract: --Adrenal glands: Normal. --Right kidney/ureter: No hydronephrosis or perinephric stranding. No nephrolithiasis. No obstructing ureteral stones. --Left kidney/ureter: No hydronephrosis or perinephric stranding. No nephrolithiasis. No obstructing ureteral stones. --Urinary bladder: Unremarkable. Stomach/Bowel: --Stomach/Duodenum: No hiatal hernia or other gastric abnormality. Normal duodenal course and caliber. --Small bowel: No  dilatation or inflammation. --Colon: No focal abnormality. --Appendix: Normal. Lymphatic:  No abdominal or pelvic lymphadenopathy. Reproductive: Status post hysterectomy. No adnexal mass. Musculoskeletal. No bony spinal canal stenosis or focal osseous abnormality. Other: None. Review of the MIP images confirms the above findings. IMPRESSION: No acute aortic syndrome or other acute abnormality of the chest, abdomen or pelvis. Electronically Signed   By: Deatra Robinson M.D.   On: 10/30/2021 00:46     ____________________________________________   PROCEDURES  Procedure(s) performed: None Procedures   Critical Care performed:  None ____________________________________________   INITIAL IMPRESSION / ASSESSMENT AND PLAN / ED COURSE  40 y.o. female with a history of DVT on Xarelto, endometriosis, ovarian cyst, anxiety who presents for evaluation of abdominal  pain.  Patient with an episode of sudden onset of severe epigastric abdominal pain radiating into her chest and upper back right after she ate.  Currently the pain has subsided.  Patient is well-appearing and in no distress, normal vital signs, abdomen is soft and nontender, extremities are warm and well-perfused with equal pulses x4.  Ddx GERD, esophageal spasm, gallbladder pathology, pancreatitis, peptic ulcer disease, gastritis, ACS, dissection.  EKG with no signs of acute ischemia.  2 high-sensitivity troponins were negative.  Normal LFTs, normal lipase, no leukocytosis, normal BMP with no significant electrolyte derangements.  CT angio of the chest abdomen pelvis showing no acute pathology. Patient's pain resolved without any intervention. Since patient is pain-free and has a negative work-up, her symptoms are most likely from GERD or esophageal spasms.  We will start patient on a an acid and refer back to primary care doctor.  Recommended returning to the emergency room if her symptoms recur.  She is tolerating p.o. with no further episodes of pain.      _____________________________________________ Please note:  Patient was evaluated in Emergency Department today for the symptoms described in the history of present illness. Patient was evaluated in the context of the global COVID-19 pandemic, which necessitated consideration that the patient might be at risk for infection with the SARS-CoV-2 virus that causes COVID-19. Institutional protocols and algorithms that pertain to the evaluation of patients at risk for COVID-19 are in a state of rapid change based on information released by regulatory bodies including the CDC and federal and state organizations. These policies and algorithms were followed during the patient's care in the ED.  Some ED evaluations and interventions may be delayed as a result of limited staffing during the pandemic.    Controlled Substance Database was reviewed by  me. ____________________________________________   FINAL CLINICAL IMPRESSION(S) / ED DIAGNOSES   Final diagnoses:  Epigastric pain  Gastroesophageal reflux disease, unspecified whether esophagitis present      NEW MEDICATIONS STARTED DURING THIS VISIT:  ED Discharge Orders          Ordered    pantoprazole (PROTONIX) 40 MG tablet  Daily        10/30/21 0136             Note:  This document was prepared using Dragon voice recognition software and may include unintentional dictation errors.    Nita Sickle, MD 10/30/21 [phone]

## 2021-10-30 NOTE — Discharge Instructions (Signed)
